# Patient Record
Sex: Female | Born: 1935 | Race: White | Hispanic: No | Smoking: Never smoker
Health system: Southern US, Community
[De-identification: ages and names within clinical notes are randomized; demographics above are authoritative.]

## PROBLEM LIST (undated history)

## (undated) DIAGNOSIS — I482 Chronic atrial fibrillation, unspecified: Secondary | ICD-10-CM

## (undated) DIAGNOSIS — I10 Essential (primary) hypertension: Secondary | ICD-10-CM

## (undated) DIAGNOSIS — E785 Hyperlipidemia, unspecified: Secondary | ICD-10-CM

## (undated) DIAGNOSIS — Z860100 Personal history of colon polyps, unspecified: Secondary | ICD-10-CM

## (undated) DIAGNOSIS — K648 Other hemorrhoids: Secondary | ICD-10-CM

## (undated) DIAGNOSIS — K5790 Diverticulosis of intestine, part unspecified, without perforation or abscess without bleeding: Secondary | ICD-10-CM

## (undated) DIAGNOSIS — F0390 Unspecified dementia without behavioral disturbance: Secondary | ICD-10-CM

## (undated) HISTORY — DX: Diverticulosis of intestine, part unspecified, without perforation or abscess without bleeding: K57.90

## (undated) HISTORY — DX: Unspecified dementia, unspecified severity, without behavioral disturbance, psychotic disturbance, mood disturbance, and anxiety: F03.90

## (undated) HISTORY — DX: Chronic atrial fibrillation, unspecified: I48.20

## (undated) HISTORY — PX: COLONOSCOPY: SHX174

## (undated) HISTORY — PX: BREAST LUMPECTOMY: SHX2

## (undated) HISTORY — DX: Personal history of colon polyps, unspecified: Z86.0100

## (undated) HISTORY — DX: Other hemorrhoids: K64.8

## (undated) HISTORY — PX: CATARACT EXTRACTION: SUR2

## (undated) HISTORY — DX: Personal history of colonic polyps: Z86.010

## (undated) HISTORY — PX: TUBAL LIGATION: SHX77

## (undated) HISTORY — DX: Essential (primary) hypertension: I10

## (undated) HISTORY — DX: Hyperlipidemia, unspecified: E78.5

---

## 2001-07-16 ENCOUNTER — Ambulatory Visit (HOSPITAL_COMMUNITY): Admission: RE | Admit: 2001-07-16 | Discharge: 2001-07-16 | Payer: Self-pay | Admitting: Family Medicine

## 2001-07-16 ENCOUNTER — Encounter: Payer: Self-pay | Admitting: Family Medicine

## 2001-10-02 ENCOUNTER — Other Ambulatory Visit: Admission: RE | Admit: 2001-10-02 | Discharge: 2001-10-02 | Payer: Self-pay | Admitting: Family Medicine

## 2002-07-17 ENCOUNTER — Ambulatory Visit (HOSPITAL_COMMUNITY): Admission: RE | Admit: 2002-07-17 | Discharge: 2002-07-17 | Payer: Self-pay | Admitting: Family Medicine

## 2002-07-17 ENCOUNTER — Encounter: Payer: Self-pay | Admitting: Family Medicine

## 2002-10-01 ENCOUNTER — Ambulatory Visit (HOSPITAL_COMMUNITY): Admission: RE | Admit: 2002-10-01 | Discharge: 2002-10-01 | Payer: Self-pay | Admitting: Family Medicine

## 2002-10-01 ENCOUNTER — Encounter: Payer: Self-pay | Admitting: Family Medicine

## 2003-07-21 ENCOUNTER — Ambulatory Visit (HOSPITAL_COMMUNITY): Admission: RE | Admit: 2003-07-21 | Discharge: 2003-07-21 | Payer: Self-pay | Admitting: Family Medicine

## 2003-07-21 ENCOUNTER — Encounter: Payer: Self-pay | Admitting: Family Medicine

## 2003-07-29 ENCOUNTER — Encounter: Payer: Self-pay | Admitting: Family Medicine

## 2003-07-29 ENCOUNTER — Ambulatory Visit (HOSPITAL_COMMUNITY): Admission: RE | Admit: 2003-07-29 | Discharge: 2003-07-29 | Payer: Self-pay | Admitting: Family Medicine

## 2004-06-23 ENCOUNTER — Ambulatory Visit (HOSPITAL_COMMUNITY): Admission: RE | Admit: 2004-06-23 | Discharge: 2004-06-23 | Payer: Self-pay | Admitting: *Deleted

## 2004-08-03 ENCOUNTER — Ambulatory Visit (HOSPITAL_COMMUNITY): Admission: RE | Admit: 2004-08-03 | Discharge: 2004-08-03 | Payer: Self-pay | Admitting: Family Medicine

## 2004-10-17 ENCOUNTER — Ambulatory Visit: Payer: Self-pay | Admitting: *Deleted

## 2004-11-16 ENCOUNTER — Ambulatory Visit: Payer: Self-pay | Admitting: *Deleted

## 2004-12-18 ENCOUNTER — Ambulatory Visit: Payer: Self-pay | Admitting: *Deleted

## 2005-01-19 ENCOUNTER — Ambulatory Visit: Payer: Self-pay | Admitting: *Deleted

## 2005-02-09 ENCOUNTER — Ambulatory Visit: Payer: Self-pay | Admitting: Cardiology

## 2005-03-15 ENCOUNTER — Ambulatory Visit: Payer: Self-pay | Admitting: *Deleted

## 2005-04-16 ENCOUNTER — Ambulatory Visit: Payer: Self-pay | Admitting: *Deleted

## 2005-05-21 ENCOUNTER — Ambulatory Visit: Payer: Self-pay | Admitting: *Deleted

## 2005-06-21 ENCOUNTER — Ambulatory Visit: Payer: Self-pay | Admitting: Cardiology

## 2005-07-06 ENCOUNTER — Ambulatory Visit: Payer: Self-pay | Admitting: Family Medicine

## 2005-07-19 ENCOUNTER — Ambulatory Visit: Payer: Self-pay | Admitting: *Deleted

## 2005-07-24 ENCOUNTER — Ambulatory Visit: Payer: Self-pay | Admitting: Gastroenterology

## 2005-08-06 ENCOUNTER — Ambulatory Visit (HOSPITAL_COMMUNITY): Admission: RE | Admit: 2005-08-06 | Discharge: 2005-08-06 | Payer: Self-pay | Admitting: Family Medicine

## 2005-08-17 ENCOUNTER — Ambulatory Visit: Payer: Self-pay | Admitting: Internal Medicine

## 2005-08-24 ENCOUNTER — Ambulatory Visit: Payer: Self-pay | Admitting: Gastroenterology

## 2005-08-24 ENCOUNTER — Encounter (INDEPENDENT_AMBULATORY_CARE_PROVIDER_SITE_OTHER): Payer: Self-pay | Admitting: Specialist

## 2005-08-29 ENCOUNTER — Ambulatory Visit (HOSPITAL_COMMUNITY): Admission: RE | Admit: 2005-08-29 | Discharge: 2005-08-29 | Payer: Self-pay | Admitting: Family Medicine

## 2005-09-14 ENCOUNTER — Ambulatory Visit: Payer: Self-pay | Admitting: *Deleted

## 2005-10-05 ENCOUNTER — Ambulatory Visit: Payer: Self-pay | Admitting: Cardiology

## 2005-11-02 ENCOUNTER — Ambulatory Visit: Payer: Self-pay | Admitting: Cardiology

## 2005-12-03 ENCOUNTER — Ambulatory Visit: Payer: Self-pay | Admitting: *Deleted

## 2005-12-31 ENCOUNTER — Ambulatory Visit: Payer: Self-pay | Admitting: *Deleted

## 2006-02-06 ENCOUNTER — Ambulatory Visit: Payer: Self-pay | Admitting: Cardiology

## 2006-03-15 ENCOUNTER — Ambulatory Visit: Payer: Self-pay | Admitting: Cardiology

## 2006-04-15 ENCOUNTER — Ambulatory Visit: Payer: Self-pay | Admitting: *Deleted

## 2006-05-15 ENCOUNTER — Ambulatory Visit: Payer: Self-pay | Admitting: *Deleted

## 2006-07-11 ENCOUNTER — Ambulatory Visit: Payer: Self-pay | Admitting: Family Medicine

## 2006-08-15 ENCOUNTER — Encounter: Admission: RE | Admit: 2006-08-15 | Discharge: 2006-08-15 | Payer: Self-pay | Admitting: Family Medicine

## 2006-10-29 ENCOUNTER — Ambulatory Visit: Payer: Self-pay | Admitting: Cardiology

## 2006-11-20 ENCOUNTER — Ambulatory Visit: Payer: Self-pay | Admitting: Cardiology

## 2006-12-24 ENCOUNTER — Ambulatory Visit: Payer: Self-pay | Admitting: Cardiology

## 2007-01-24 ENCOUNTER — Ambulatory Visit: Payer: Self-pay | Admitting: Cardiology

## 2007-02-07 ENCOUNTER — Ambulatory Visit: Payer: Self-pay | Admitting: Internal Medicine

## 2007-02-18 ENCOUNTER — Ambulatory Visit: Payer: Self-pay | Admitting: *Deleted

## 2007-02-28 ENCOUNTER — Ambulatory Visit: Payer: Self-pay | Admitting: Cardiology

## 2007-03-31 ENCOUNTER — Ambulatory Visit: Payer: Self-pay | Admitting: Cardiology

## 2007-04-29 ENCOUNTER — Ambulatory Visit: Payer: Self-pay | Admitting: Cardiology

## 2007-06-02 ENCOUNTER — Ambulatory Visit: Payer: Self-pay | Admitting: Internal Medicine

## 2007-07-03 ENCOUNTER — Ambulatory Visit: Payer: Self-pay | Admitting: Cardiology

## 2007-07-14 DIAGNOSIS — I10 Essential (primary) hypertension: Secondary | ICD-10-CM

## 2007-07-14 DIAGNOSIS — I4891 Unspecified atrial fibrillation: Secondary | ICD-10-CM

## 2007-07-14 DIAGNOSIS — M810 Age-related osteoporosis without current pathological fracture: Secondary | ICD-10-CM | POA: Insufficient documentation

## 2007-07-17 ENCOUNTER — Ambulatory Visit: Payer: Self-pay | Admitting: Family Medicine

## 2007-07-31 ENCOUNTER — Ambulatory Visit: Payer: Self-pay | Admitting: Cardiology

## 2007-08-28 ENCOUNTER — Ambulatory Visit: Payer: Self-pay | Admitting: Internal Medicine

## 2007-09-24 ENCOUNTER — Encounter: Admission: RE | Admit: 2007-09-24 | Discharge: 2007-09-24 | Payer: Self-pay | Admitting: Family Medicine

## 2007-09-24 ENCOUNTER — Encounter: Payer: Self-pay | Admitting: Family Medicine

## 2007-09-29 ENCOUNTER — Ambulatory Visit: Payer: Self-pay | Admitting: Cardiology

## 2007-10-27 ENCOUNTER — Ambulatory Visit: Payer: Self-pay | Admitting: Internal Medicine

## 2007-11-07 ENCOUNTER — Ambulatory Visit: Payer: Self-pay | Admitting: Cardiology

## 2007-12-02 ENCOUNTER — Ambulatory Visit: Payer: Self-pay | Admitting: Cardiovascular Disease

## 2007-12-29 ENCOUNTER — Ambulatory Visit: Payer: Self-pay | Admitting: Cardiology

## 2008-01-12 ENCOUNTER — Ambulatory Visit: Payer: Self-pay | Admitting: Cardiology

## 2008-02-09 ENCOUNTER — Ambulatory Visit: Payer: Self-pay | Admitting: Cardiology

## 2008-03-09 ENCOUNTER — Ambulatory Visit: Payer: Self-pay | Admitting: Cardiology

## 2008-04-06 ENCOUNTER — Ambulatory Visit: Payer: Self-pay | Admitting: Cardiology

## 2008-07-13 ENCOUNTER — Ambulatory Visit: Payer: Self-pay | Admitting: Cardiology

## 2008-08-12 ENCOUNTER — Ambulatory Visit: Payer: Self-pay | Admitting: Cardiology

## 2008-08-17 ENCOUNTER — Ambulatory Visit: Payer: Self-pay | Admitting: Family Medicine

## 2008-08-17 DIAGNOSIS — L02419 Cutaneous abscess of limb, unspecified: Secondary | ICD-10-CM | POA: Insufficient documentation

## 2008-08-17 DIAGNOSIS — R609 Edema, unspecified: Secondary | ICD-10-CM | POA: Insufficient documentation

## 2008-08-17 DIAGNOSIS — L03119 Cellulitis of unspecified part of limb: Secondary | ICD-10-CM

## 2008-08-27 ENCOUNTER — Ambulatory Visit: Payer: Self-pay | Admitting: Cardiology

## 2008-09-02 ENCOUNTER — Ambulatory Visit: Payer: Self-pay | Admitting: Cardiology

## 2008-09-02 ENCOUNTER — Encounter: Payer: Self-pay | Admitting: Cardiology

## 2008-09-02 ENCOUNTER — Ambulatory Visit (HOSPITAL_COMMUNITY): Admission: RE | Admit: 2008-09-02 | Discharge: 2008-09-02 | Payer: Self-pay | Admitting: Cardiology

## 2008-09-03 ENCOUNTER — Ambulatory Visit: Payer: Self-pay | Admitting: Family Medicine

## 2008-09-03 DIAGNOSIS — R599 Enlarged lymph nodes, unspecified: Secondary | ICD-10-CM | POA: Insufficient documentation

## 2008-09-06 ENCOUNTER — Ambulatory Visit: Payer: Self-pay | Admitting: Cardiology

## 2008-09-16 ENCOUNTER — Ambulatory Visit: Payer: Self-pay | Admitting: Cardiology

## 2008-09-17 ENCOUNTER — Encounter: Admission: RE | Admit: 2008-09-17 | Discharge: 2008-09-17 | Payer: Self-pay | Admitting: Family Medicine

## 2008-09-17 ENCOUNTER — Telehealth: Payer: Self-pay | Admitting: Family Medicine

## 2008-10-08 ENCOUNTER — Ambulatory Visit (HOSPITAL_COMMUNITY): Admission: RE | Admit: 2008-10-08 | Discharge: 2008-10-08 | Payer: Self-pay | Admitting: Cardiology

## 2008-10-08 ENCOUNTER — Telehealth: Payer: Self-pay | Admitting: Family Medicine

## 2008-10-08 ENCOUNTER — Ambulatory Visit: Payer: Self-pay | Admitting: Cardiology

## 2008-11-24 ENCOUNTER — Ambulatory Visit: Payer: Self-pay | Admitting: Cardiology

## 2008-11-24 ENCOUNTER — Encounter (INDEPENDENT_AMBULATORY_CARE_PROVIDER_SITE_OTHER): Payer: Self-pay | Admitting: *Deleted

## 2008-11-24 LAB — CONVERTED CEMR LAB
Cholesterol: 226 mg/dL
HDL: 65 mg/dL
T3, Free: 3.2 pg/mL
Triglycerides: 120 mg/dL

## 2008-12-09 ENCOUNTER — Encounter: Admission: RE | Admit: 2008-12-09 | Discharge: 2008-12-09 | Payer: Self-pay | Admitting: Family Medicine

## 2009-01-13 ENCOUNTER — Ambulatory Visit: Payer: Self-pay | Admitting: Cardiology

## 2009-01-20 ENCOUNTER — Ambulatory Visit: Payer: Self-pay | Admitting: Cardiology

## 2009-01-31 ENCOUNTER — Ambulatory Visit: Payer: Self-pay | Admitting: Cardiology

## 2009-02-17 ENCOUNTER — Ambulatory Visit: Payer: Self-pay | Admitting: Cardiology

## 2009-03-10 ENCOUNTER — Ambulatory Visit: Payer: Self-pay | Admitting: Cardiology

## 2009-04-11 ENCOUNTER — Ambulatory Visit: Payer: Self-pay | Admitting: Cardiology

## 2009-04-28 ENCOUNTER — Ambulatory Visit: Payer: Self-pay | Admitting: Cardiology

## 2009-05-26 ENCOUNTER — Ambulatory Visit: Payer: Self-pay | Admitting: Cardiology

## 2009-06-30 ENCOUNTER — Ambulatory Visit: Payer: Self-pay | Admitting: Cardiology

## 2009-07-02 IMAGING — US US EXTREM LOW VENOUS BILAT
1 series · 13 of 24 positions shown · non-contrast
Comparison: None

CLINICAL DATA: Bilateral lower extremity edema

VENOUS DUPLEX ULTRASOUND OF BILATERAL LOWER EXTREMITIES
TECHNIQUE: Gray-scale sonography with graded compression, as well
as color Doppler and duplex ultrasound, were performed to evaluate
the deep venous system of both lower extremities from the level of
the common femoral vein through the popliteal and proximal calf
veins. Spectral Doppler was utilized to evaulate flow at rest and
with distal augmentation maneuvers.

[Series 1: unknown · 13 of 75 slices shown]
[im 1/75]
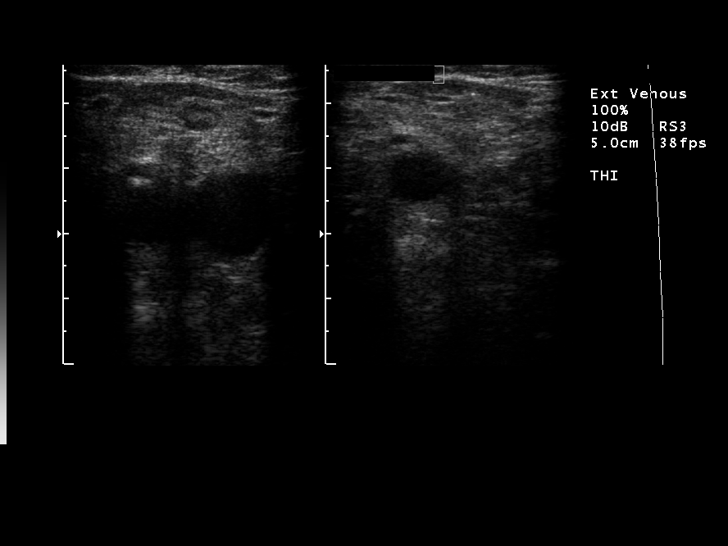
[im 7/75]
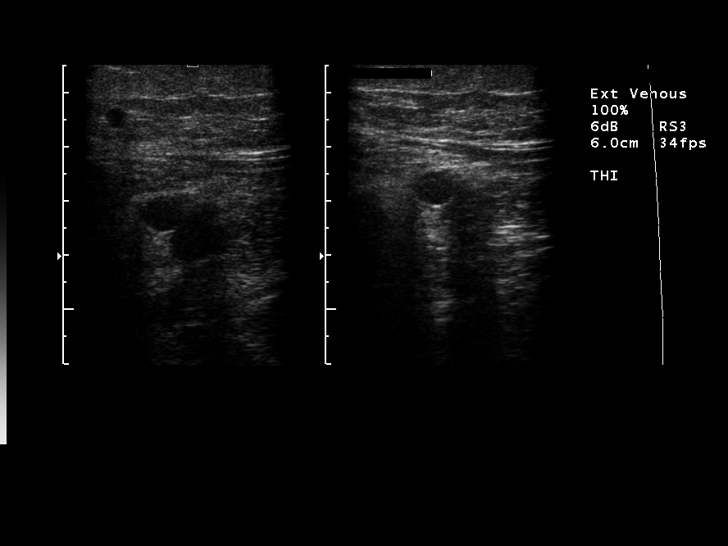
[im 13/75]
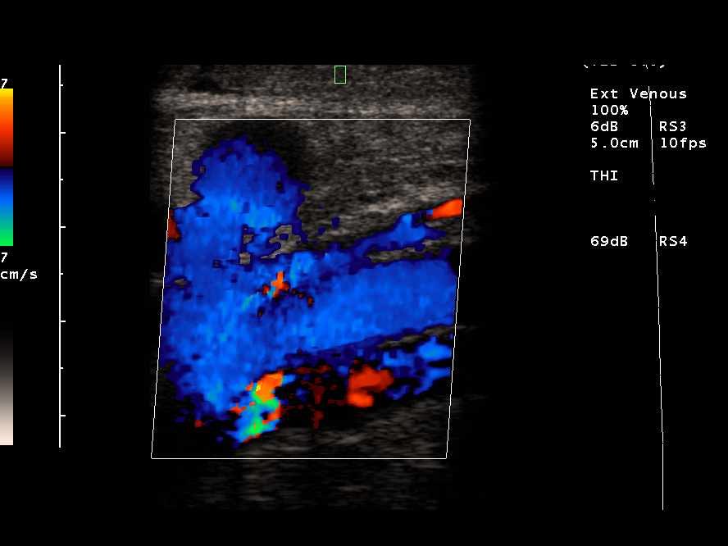
[im 20/75]
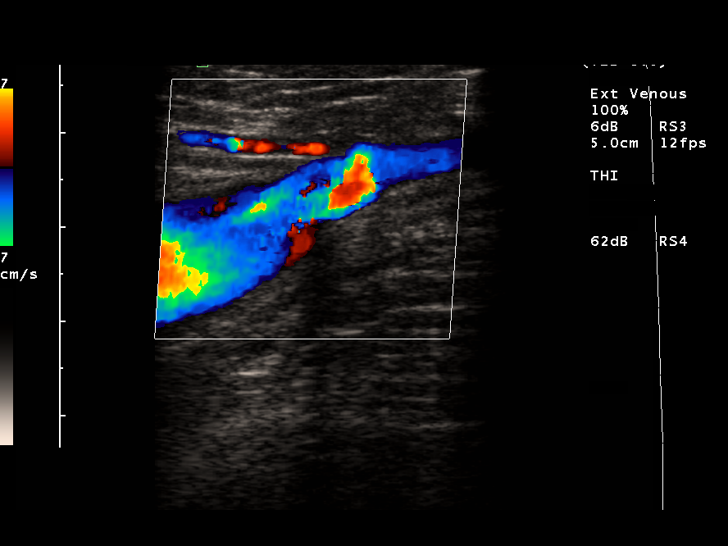
[im 26/75]
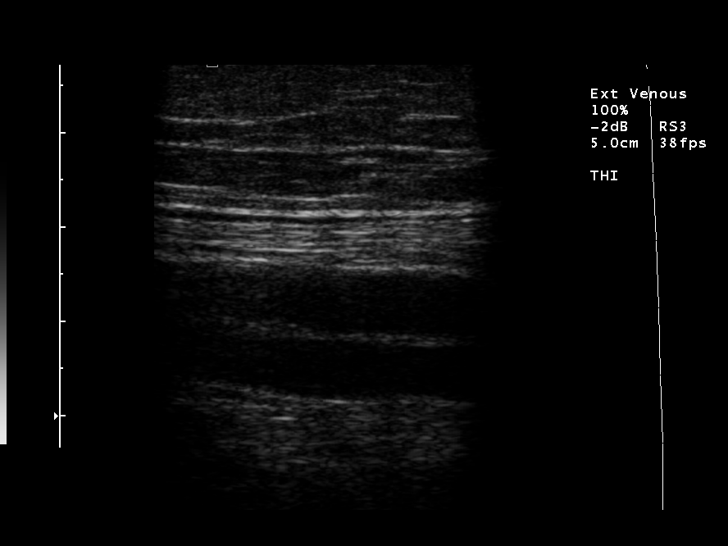
[im 33/75]
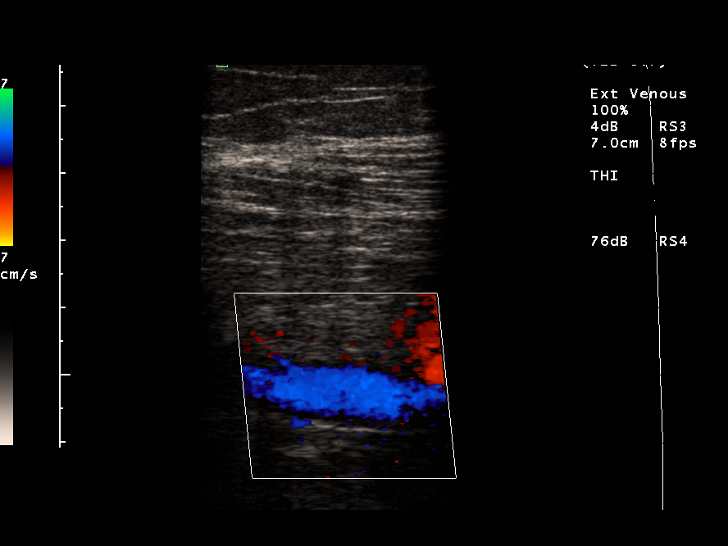
[im 39/75]
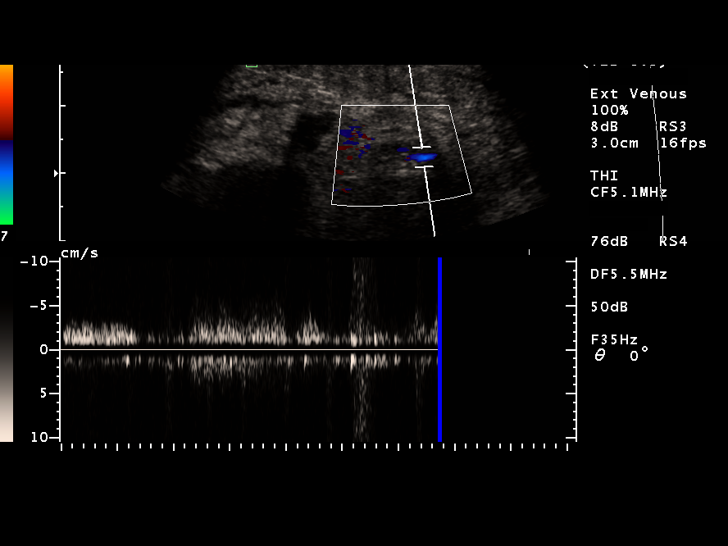
[im 42/75]
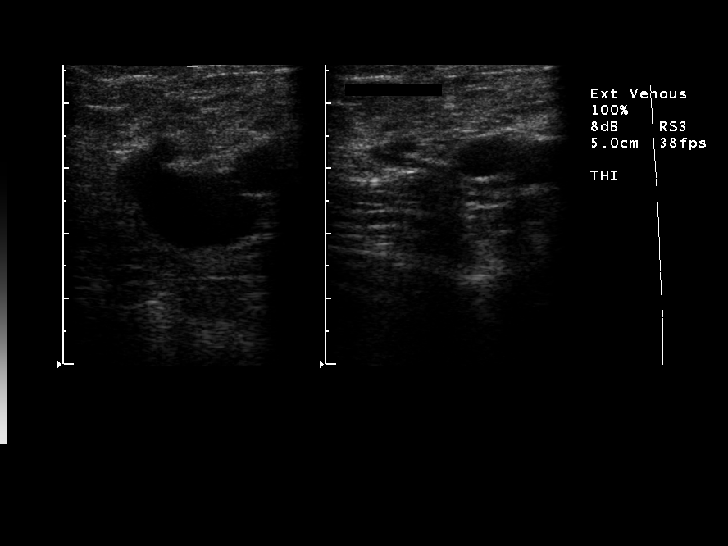
[im 49/75]
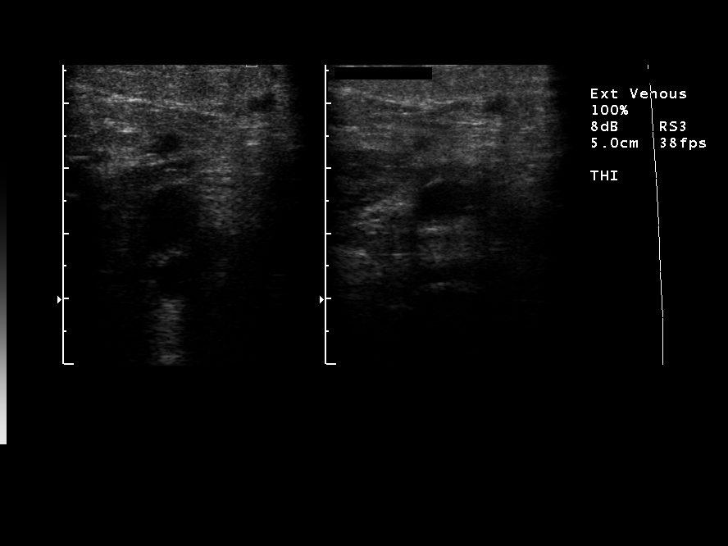
[im 55/75]
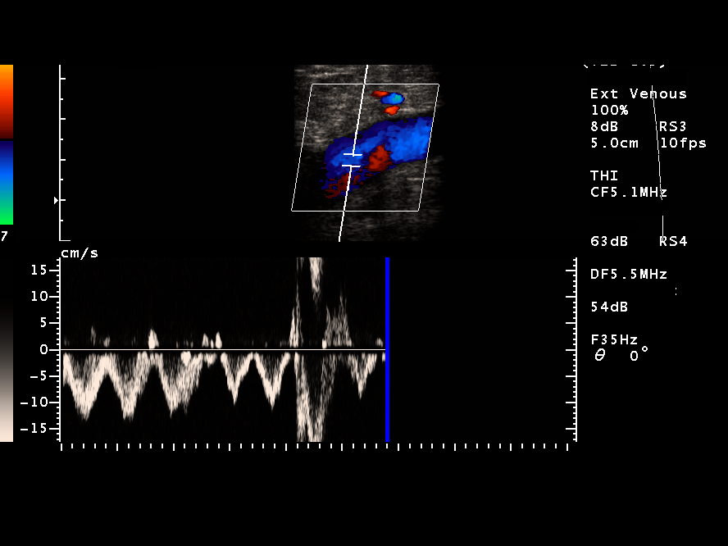
[im 62/75]
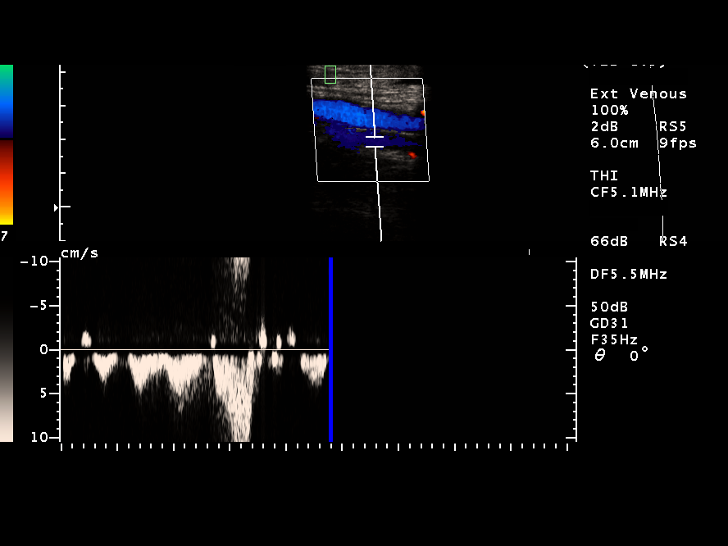
[im 68/75]
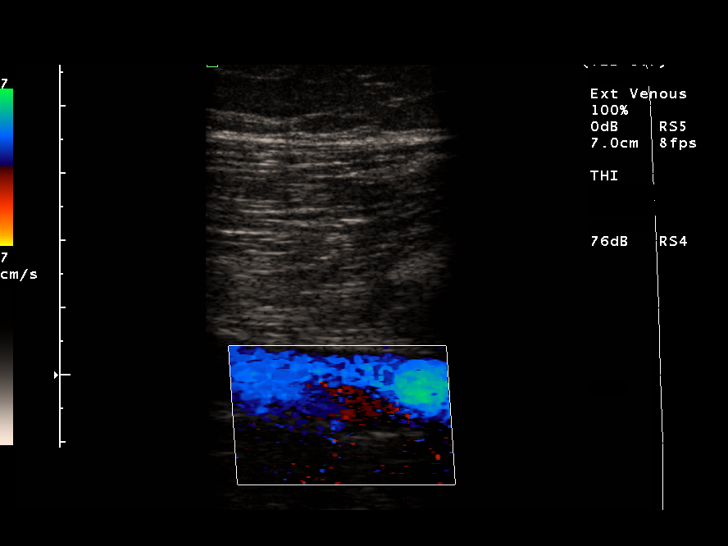
[im 75/75]
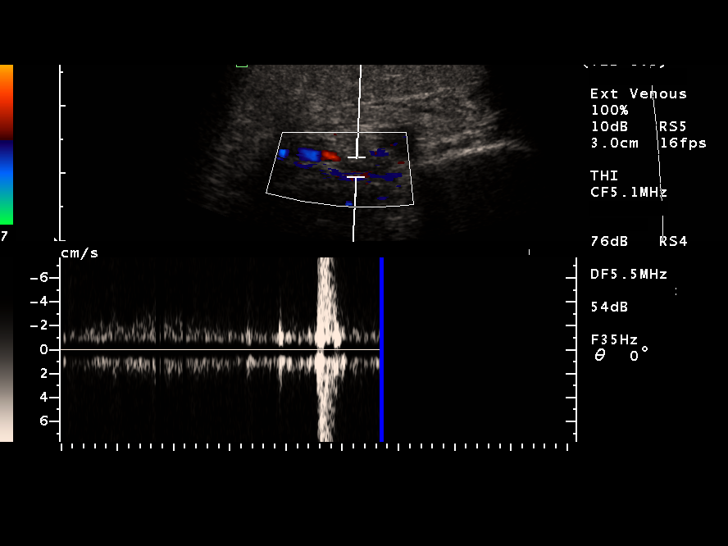

[13 of 24 positions shown; findings below may reference images not displayed]

FINDINGS: Prominent lymph node in the right inguinal region
measuring up to 2.4 cm.  Probable varicosity the right popliteal
region.  There is normal compressibility, augmentation and color
Doppler flow in the right common femoral vein, right femoral vein
and right popliteal vein.  This flow in the right posterior tibial
veins.

There is a prominent lymph node in the left inguinal region.  Node
measures up to 3.4 cm.  Normal compressibility, augmentation and
color flow in the left common femoral vein, left femoral vein and
left popliteal vein.  Evidence of flow in the left posterior tibial
veins.
IMPRESSION: Negative for DVT.

Prominent lymph nodes in both inguinal regions.

Varicosity in the right popliteal region.

## 2009-07-11 ENCOUNTER — Encounter: Payer: Self-pay | Admitting: *Deleted

## 2009-07-14 ENCOUNTER — Ambulatory Visit: Payer: Self-pay | Admitting: Cardiology

## 2009-07-17 IMAGING — US US ABDOMEN COMPLETE
1 series · 13 of 25 positions shown · non-contrast
Comparison: 09/06/2008 CT.

CLINICAL DATA: Abnormal CT.

ABDOMEN ULTRASOUND
TECHNIQUE: Complete abdominal ultrasound examination was performed
including evaluation of the liver, gallbladder, bile ducts,
pancreas, kidneys, spleen, IVC, and abdominal aorta.

[Series 1: us abdomen complete · 0.32mm/px · 13 of 67 slices shown]
[im 1/67]
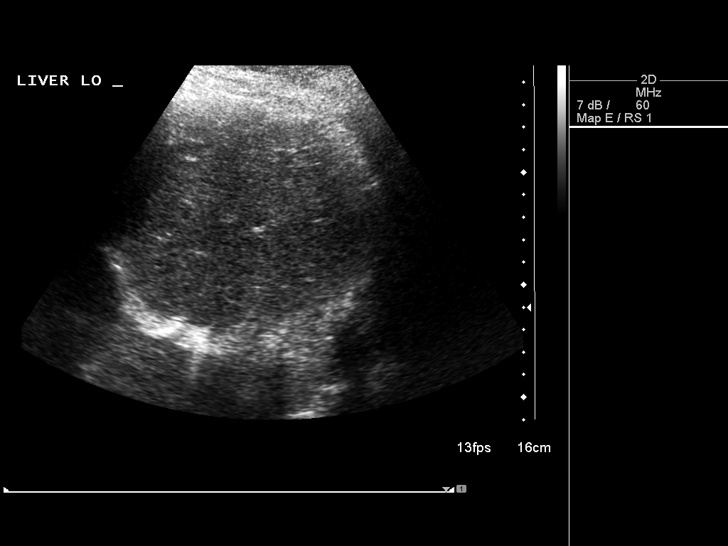
[im 6/67]
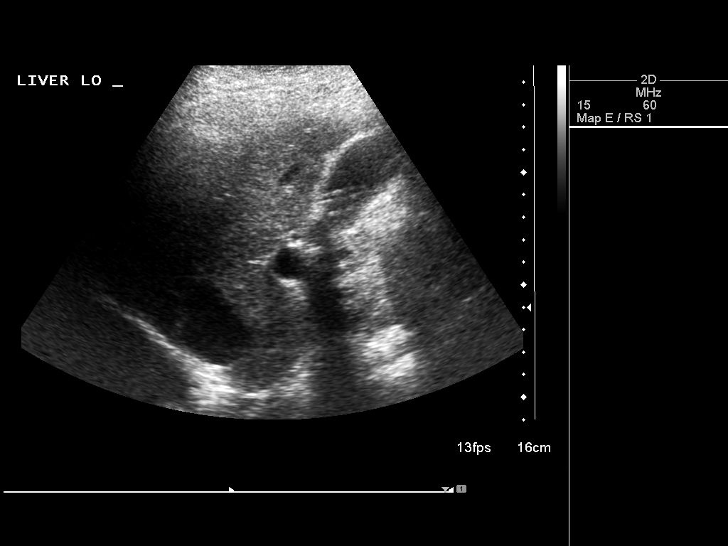
[im 12/67]
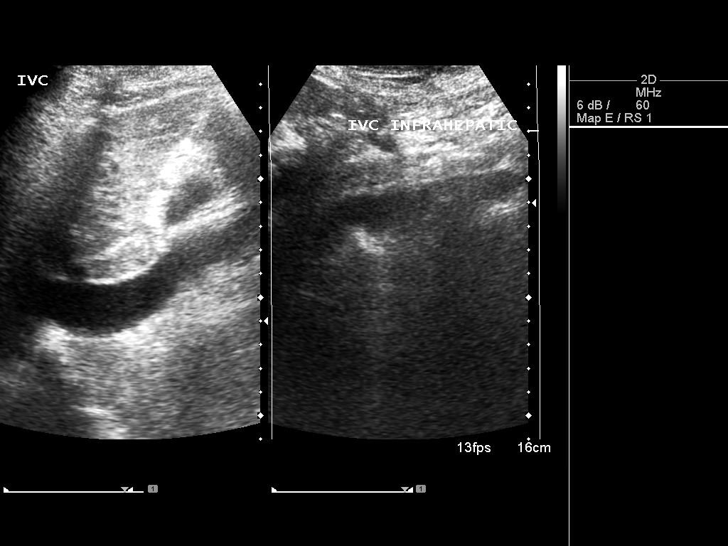
[im 17/67]
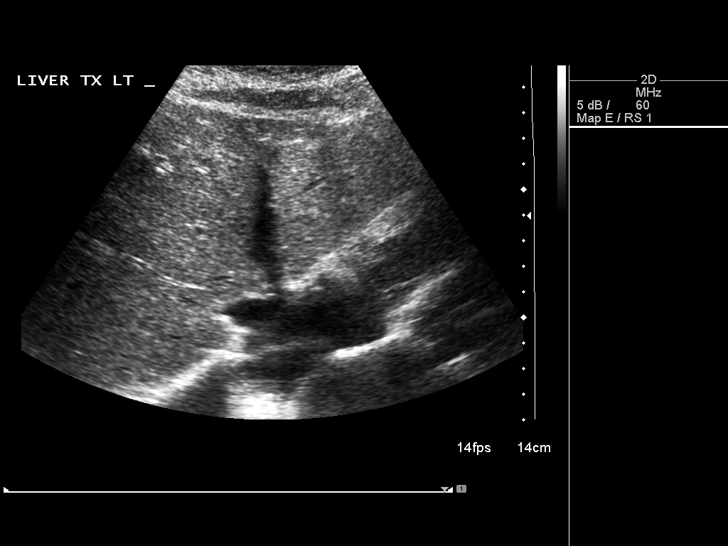
[im 23/67]
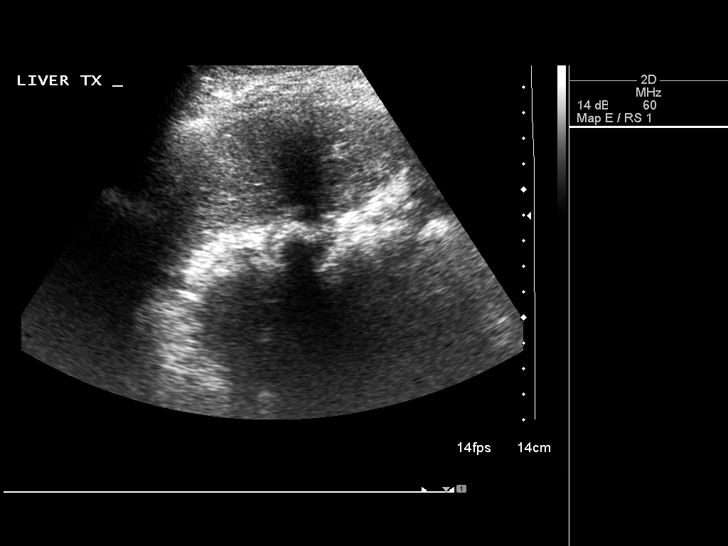
[im 28/67]
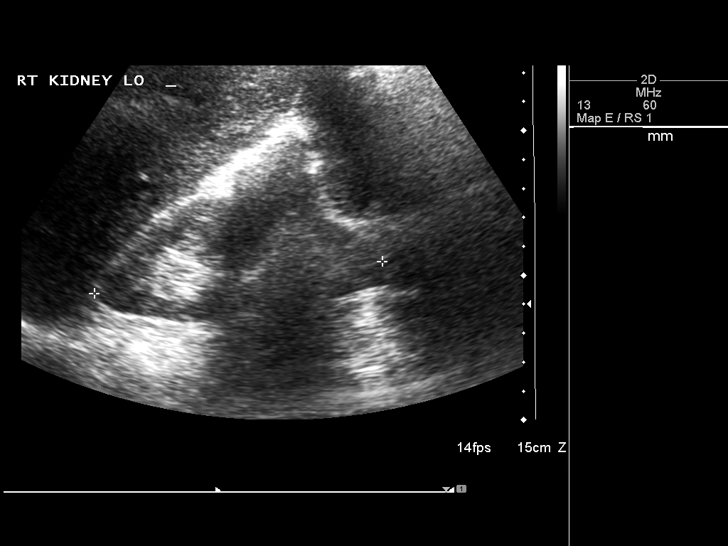
[im 34/67]
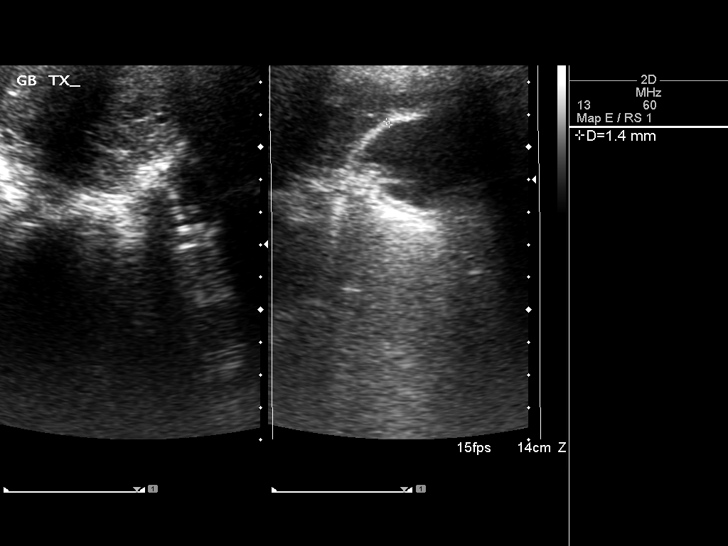
[im 39/67]
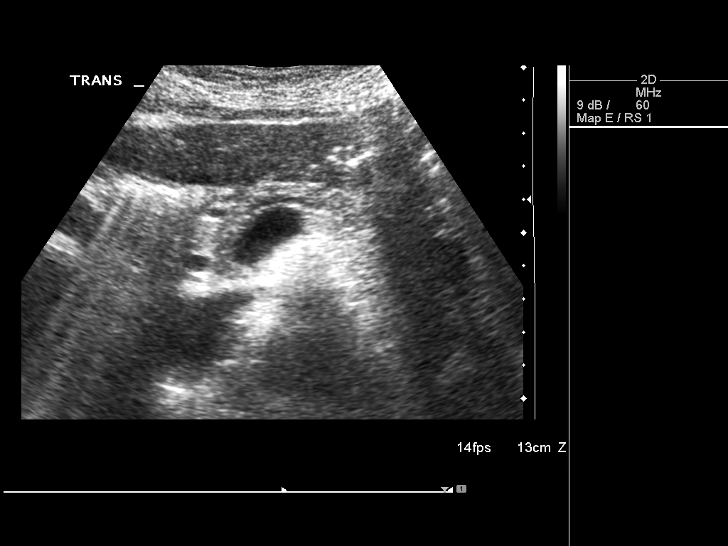
[im 45/67]
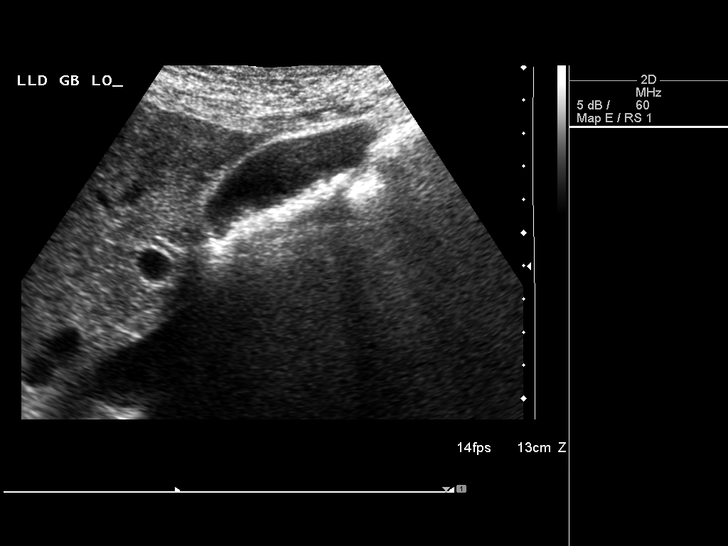
[im 50/67]
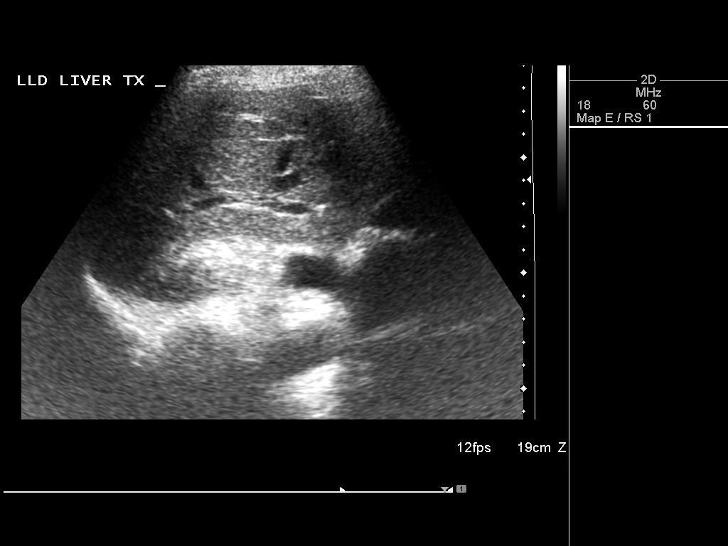
[im 56/67]
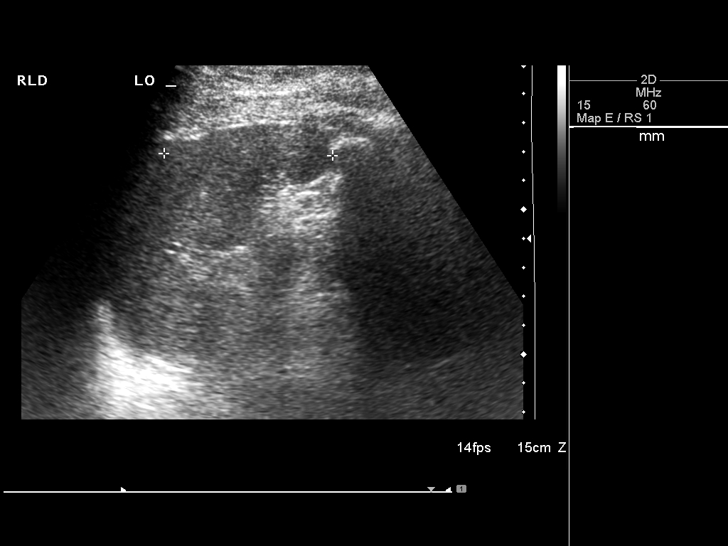
[im 61/67]
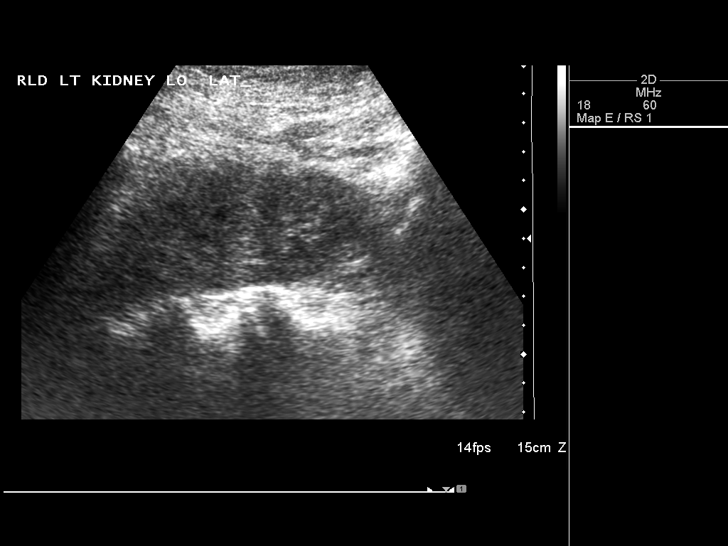
[im 67/67]
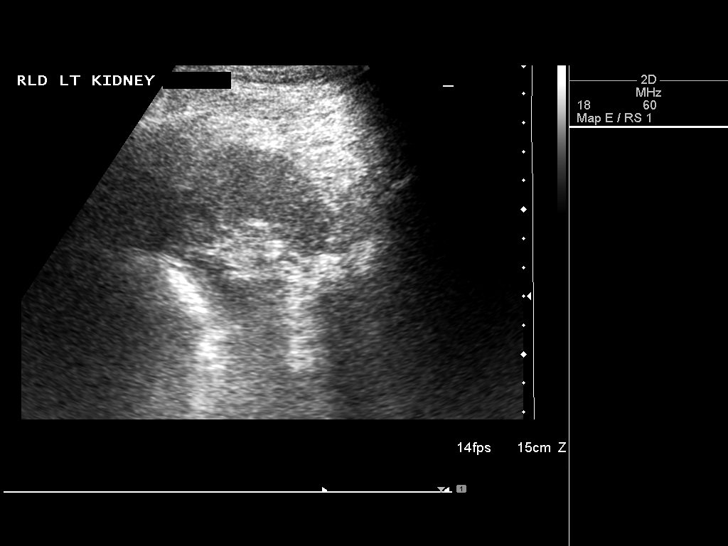

[13 of 25 positions shown; findings below may reference images not displayed]

FINDINGS: The liver has a slightly atypical contour with prominent
medially extending inferior right lobe.  A discrete liver lesion is
not identified.  It is possible what was noted on the prior CT scan
is caused by streak artifact from adjacent barium containing bowel.
One could perform MR at the present time for further delineation or
help confirm stability on follow-up CT scan in the next few months.

Multiple mobile small gallstones.  The patient was not tender over
this region during scanning.  Gallbladder wall thickness within
normal limits.  No pericholecystic fluid.  Common bile duct 3.9 mm.
No intrahepatic biliary duct dilatation.

Slightly limited evaluation of pancreatic tail otherwise no
pancreatic masses identified.  Pancreatic duct 1.6 mm.  Inferior
vena cava unremarkable.  Atherosclerotic type changes of the aorta
without aneurysmal dilatation.

Right kidney 10 cm and left kidney 10.6 cm length.  Slightly limit
evaluation of portions of the right kidney secondary to adjacent
bowel.  No hydronephrosis or discrete neural mass is noted.  Spleen
unremarkable.
IMPRESSION: No liver lesion is detected by ultrasound.  What was noted on CT
may be related to an artifact.  One can further evaluate this with
MR at the present time or establish stability on follow-up CT in
the next few months.

Multiple small mobile gallstones without gallbladder wall
thickening or pericholecystic fluid.  The the patient was not
tender over this region during scanning.

## 2009-08-10 ENCOUNTER — Ambulatory Visit: Payer: Self-pay | Admitting: Cardiology

## 2009-08-10 LAB — CONVERTED CEMR LAB: POC INR: 3.7

## 2009-08-30 ENCOUNTER — Telehealth: Payer: Self-pay | Admitting: Family Medicine

## 2009-09-05 ENCOUNTER — Ambulatory Visit: Payer: Self-pay | Admitting: Family Medicine

## 2009-09-07 ENCOUNTER — Ambulatory Visit: Payer: Self-pay | Admitting: Cardiology

## 2009-09-19 ENCOUNTER — Ambulatory Visit: Payer: Self-pay | Admitting: Family Medicine

## 2009-09-19 ENCOUNTER — Encounter: Payer: Self-pay | Admitting: Family Medicine

## 2009-09-22 ENCOUNTER — Ambulatory Visit: Payer: Self-pay | Admitting: Cardiology

## 2009-10-13 ENCOUNTER — Ambulatory Visit: Payer: Self-pay | Admitting: Cardiology

## 2009-11-10 ENCOUNTER — Encounter (INDEPENDENT_AMBULATORY_CARE_PROVIDER_SITE_OTHER): Payer: Self-pay | Admitting: Cardiology

## 2009-11-11 ENCOUNTER — Telehealth: Payer: Self-pay | Admitting: Family Medicine

## 2009-12-14 ENCOUNTER — Encounter (INDEPENDENT_AMBULATORY_CARE_PROVIDER_SITE_OTHER): Payer: Self-pay | Admitting: Cardiology

## 2009-12-22 ENCOUNTER — Encounter: Payer: Self-pay | Admitting: Family Medicine

## 2009-12-28 ENCOUNTER — Telehealth (INDEPENDENT_AMBULATORY_CARE_PROVIDER_SITE_OTHER): Payer: Self-pay | Admitting: *Deleted

## 2009-12-31 ENCOUNTER — Encounter: Admission: RE | Admit: 2009-12-31 | Discharge: 2009-12-31 | Payer: Self-pay | Admitting: Neurology

## 2010-02-07 ENCOUNTER — Encounter (INDEPENDENT_AMBULATORY_CARE_PROVIDER_SITE_OTHER): Payer: Self-pay | Admitting: *Deleted

## 2010-02-09 DIAGNOSIS — E785 Hyperlipidemia, unspecified: Secondary | ICD-10-CM | POA: Insufficient documentation

## 2010-02-10 ENCOUNTER — Ambulatory Visit: Payer: Self-pay | Admitting: Cardiology

## 2010-02-21 ENCOUNTER — Encounter: Admission: RE | Admit: 2010-02-21 | Discharge: 2010-02-21 | Payer: Self-pay | Admitting: Family Medicine

## 2010-03-01 ENCOUNTER — Ambulatory Visit: Payer: Self-pay | Admitting: Cardiology

## 2010-03-22 ENCOUNTER — Ambulatory Visit: Payer: Self-pay | Admitting: Cardiology

## 2010-04-13 ENCOUNTER — Encounter (INDEPENDENT_AMBULATORY_CARE_PROVIDER_SITE_OTHER): Payer: Self-pay | Admitting: Pharmacist

## 2010-05-25 ENCOUNTER — Encounter (INDEPENDENT_AMBULATORY_CARE_PROVIDER_SITE_OTHER): Payer: Self-pay | Admitting: Pharmacist

## 2010-06-19 ENCOUNTER — Ambulatory Visit: Payer: Self-pay | Admitting: Cardiology

## 2010-07-06 ENCOUNTER — Ambulatory Visit: Payer: Self-pay | Admitting: Cardiology

## 2010-07-06 LAB — CONVERTED CEMR LAB: POC INR: 1.3

## 2010-07-17 ENCOUNTER — Ambulatory Visit: Payer: Self-pay | Admitting: Cardiology

## 2010-08-03 ENCOUNTER — Ambulatory Visit: Payer: Self-pay | Admitting: Cardiology

## 2010-08-24 ENCOUNTER — Ambulatory Visit: Payer: Self-pay | Admitting: Cardiology

## 2010-08-24 LAB — CONVERTED CEMR LAB: POC INR: 2.4

## 2010-09-15 ENCOUNTER — Ambulatory Visit: Payer: Self-pay | Admitting: Family Medicine

## 2010-09-18 LAB — CONVERTED CEMR LAB
AST: 33 units/L (ref 0–37)
Alkaline Phosphatase: 64 units/L (ref 39–117)
BUN: 13 mg/dL (ref 6–23)
Basophils Relative: 0.5 % (ref 0.0–3.0)
CO2: 31 meq/L (ref 19–32)
Calcium: 9.4 mg/dL (ref 8.4–10.5)
Chloride: 101 meq/L (ref 96–112)
Direct LDL: 142.9 mg/dL
GFR calc non Af Amer: 101.74 mL/min (ref >60)
HDL: 68.1 mg/dL (ref >39.00)
Hemoglobin: 14.1 g/dL (ref 12.0–15.0)
Lymphs Abs: 1.2 10*3/uL (ref 0.7–4.0)
MCHC: 33.6 g/dL (ref 30.0–36.0)
MCV: 95.7 fL (ref 78.0–100.0)
Platelets: 162 10*3/uL (ref 150.0–400.0)
Potassium: 3.9 meq/L (ref 3.5–5.1)
RBC: 4.37 M/uL (ref 3.87–5.11)
Sodium: 143 meq/L (ref 135–145)
Total Bilirubin: 1.4 mg/dL — ABNORMAL HIGH (ref 0.3–1.2)
Total CHOL/HDL Ratio: 3
Triglycerides: 81 mg/dL (ref 0.0–149.0)

## 2010-09-21 ENCOUNTER — Ambulatory Visit: Payer: Self-pay | Admitting: Cardiology

## 2010-09-21 LAB — CONVERTED CEMR LAB: POC INR: 2.2

## 2010-11-02 ENCOUNTER — Ambulatory Visit: Payer: Self-pay | Admitting: Cardiology

## 2010-11-02 LAB — CONVERTED CEMR LAB: POC INR: 2.4

## 2010-11-30 ENCOUNTER — Ambulatory Visit: Admit: 2010-11-30 | Payer: Self-pay

## 2010-12-17 ENCOUNTER — Encounter: Payer: Self-pay | Admitting: Family Medicine

## 2010-12-24 LAB — CONVERTED CEMR LAB
ALT: 15 units/L (ref 0–35)
ALT: 16 units/L (ref 0–35)
ALT: 21 units/L (ref 0–35)
AST: 23 units/L (ref 0–37)
AST: 24 units/L (ref 0–37)
Albumin: 3.8 g/dL (ref 3.5–5.2)
Alkaline Phosphatase: 45 units/L (ref 39–117)
Alkaline Phosphatase: 47 units/L (ref 39–117)
Basophils Absolute: 0 10*3/uL (ref 0.0–0.1)
Basophils Relative: 0.7 % (ref 0.0–3.0)
Bilirubin, Direct: 0.2 mg/dL (ref 0.0–0.3)
Bilirubin, Direct: 0.2 mg/dL (ref 0.0–0.3)
CO2: 28 meq/L (ref 19–32)
CO2: 29 meq/L (ref 19–32)
Calcium: 9.2 mg/dL (ref 8.4–10.5)
Calcium: 9.6 mg/dL (ref 8.4–10.5)
Chloride: 105 meq/L (ref 96–112)
Cholesterol: 188 mg/dL (ref 0–200)
Cholesterol: 212 mg/dL — ABNORMAL HIGH (ref 0–200)
Creatinine, Ser: 0.7 mg/dL (ref 0.4–1.2)
Creatinine, Ser: 0.8 mg/dL (ref 0.4–1.2)
Eosinophils Absolute: 0 10*3/uL (ref 0.0–0.7)
Eosinophils Absolute: 0.2 10*3/uL (ref 0.0–0.7)
Eosinophils Relative: 1.8 % (ref 0.0–5.0)
Eosinophils Relative: 3.4 % (ref 0.0–5.0)
GFR calc Af Amer: 106 mL/min
GFR calc Af Amer: 91 mL/min
GFR calc non Af Amer: 75 mL/min
GFR calc non Af Amer: 87.04 mL/min (ref >60)
Glucose, Bld: 91 mg/dL (ref 70–99)
HCT: 35.5 % — ABNORMAL LOW (ref 36.0–46.0)
HDL: 52.2 mg/dL (ref >39.0)
HDL: 55.9 mg/dL (ref >39.00)
Hemoglobin: 13.2 g/dL (ref 12.0–15.0)
LDL Cholesterol: 117 mg/dL — ABNORMAL HIGH (ref 0–99)
Lymphocytes Relative: 33.3 % (ref 12.0–46.0)
Lymphs Abs: 1.3 10*3/uL (ref 0.7–4.0)
MCHC: 32.9 g/dL (ref 30.0–36.0)
MCV: 95 fL (ref 78.0–100.0)
Monocytes Absolute: 0.6 10*3/uL (ref 0.1–1.0)
Monocytes Relative: 7.3 % (ref 3.0–11.0)
Monocytes Relative: 7.4 % (ref 3.0–12.0)
Monocytes Relative: 9.6 % (ref 3.0–12.0)
Neutro Abs: 2.6 10*3/uL (ref 1.4–7.7)
Neutro Abs: 4.3 10*3/uL (ref 1.4–7.7)
Neutrophils Relative %: 56.5 % (ref 43.0–77.0)
Neutrophils Relative %: 58 % (ref 43.0–77.0)
Platelets: 139 10*3/uL — ABNORMAL LOW (ref 150–400)
Platelets: 214 10*3/uL (ref 150–400)
Potassium: 3.5 meq/L (ref 3.5–5.1)
RBC: 4.14 M/uL (ref 3.87–5.11)
RBC: 4.17 M/uL (ref 3.87–5.11)
Sodium: 142 meq/L (ref 135–145)
TSH: 0.61 microintl units/mL (ref 0.35–5.50)
Total Bilirubin: 1.8 mg/dL — ABNORMAL HIGH (ref 0.3–1.2)
Total Protein: 6.6 g/dL (ref 6.0–8.3)
Total Protein: 6.8 g/dL (ref 6.0–8.3)
Triglycerides: 77 mg/dL (ref 0.0–149.0)
WBC: 4 10*3/uL — ABNORMAL LOW (ref 4.5–10.5)

## 2010-12-26 NOTE — Assessment & Plan Note (Signed)
Summary: CPX (PT WILL COME IN FASTING) // RS   Vital Signs:  Patient profile:   77 year old female Height:      66 inches Weight:      152 pounds BMI:     24.62 O2 Sat:      95 % Temp:     98.2 degrees F Pulse rate:   82 / minute BP sitting:   110 / 70  (left arm) Cuff size:   regular  Vitals Entered By: Pura Spice, RN (September 15, 2010 9:16 AM) CC: cpx  no problems fasting    History of Present Illness: 77 yr old female here with her daughter for a cpx. She feels fine and has no concerns. She started seeing Dr. Anne Hahn this past spring for dementia, and she has been on Aricept for 6 months or so. She tolerates it well. Her daughter has not seen any real improvement in her memory, but there has been no decline either. She eats well, and she sleeps well.   Allergies (verified): No Known Drug Allergies  Past History:  Past Medical History: Chronic atrial fibrillation, sees Dr.Robert  Rothbart  in [place] Hypertension Hyperlipidemia Osteoporosis, last DEXA 09-19-09 Gastrointestinal: Clubbing polyps; diverticulosis; internal hemorrhoids, sees Dr. Russella Dar Pedal edema dementia, sees Dr. Lesia Sago  Past Surgical History: Colonoscopy 9- 29-06 per Dr. Russella Dar, we decided to not do any more after that Cataract extraction per Dr. Seward Speck Lumpectomy, right breast-2000 Tubal ligation-1975  Past History:  Care Management: Cardiology: Dr  Dietrich Pates Ophthalmology: DR Daisy Lazar  Family History: Reviewed history from 02/10/2010 and no changes required. Family history of arrhythmia  Social History: Reviewed history from 08/17/2008 and no changes required. Never Smoked Alcohol use-no Drug use-no Regular exercise-no Widow Retired  Review of Systems  The patient denies anorexia, fever, weight loss, weight gain, vision loss, decreased hearing, hoarseness, chest pain, syncope, dyspnea on exertion, peripheral edema, prolonged cough, headaches, hemoptysis, abdominal pain,  melena, hematochezia, severe indigestion/heartburn, hematuria, incontinence, genital sores, muscle weakness, suspicious skin lesions, transient blindness, difficulty walking, depression, unusual weight change, abnormal bleeding, enlarged lymph nodes, angioedema, breast masses, and testicular masses.         Flu Vaccine Consent Questions     Do you have a history of severe allergic reactions to this vaccine? no    Any prior history of allergic reactions to egg and/or gelatin? no    Do you have a sensitivity to the preservative Thimersol? no    Do you have a past history of Guillan-Barre Syndrome? no    Do you currently have an acute febrile illness? no    Have you ever had a severe reaction to latex? no    Vaccine information given and explained to patient? yes    Are you currently pregnant? no    Lot Number:[id number]   Exp Date:05/26/2011   Site Given  Left Deltoid IM .signsign   Physical Exam  General:  Well-developed,well-nourished,in no acute distress; alert,appropriate and cooperative throughout examination Head:  Normocephalic and atraumatic without obvious abnormalities. No apparent alopecia or balding. Eyes:  No corneal or conjunctival inflammation noted. EOMI. Perrla. Funduscopic exam benign, without hemorrhages, exudates or papilledema. Vision grossly normal. Ears:  External ear exam shows no significant lesions or deformities.  Otoscopic examination reveals clear canals, tympanic membranes are intact bilaterally without bulging, retraction, inflammation or discharge. Hearing is grossly normal bilaterally. Nose:  External nasal examination shows no deformity or inflammation. Nasal mucosa are pink and moist without  lesions or exudates. Mouth:  Oral mucosa and oropharynx without lesions or exudates.  Teeth in good repair. Neck:  No deformities, masses, or tenderness noted. Chest Wall:  No deformities, masses, or tenderness noted. Lungs:  Normal respiratory effort, chest expands  symmetrically. Lungs are clear to auscultation, no crackles or wheezes. Heart:  normal rate, no murmur, no gallop, no rub, no JVD, no HJR, and irregular rhythm.   Abdomen:  Bowel sounds positive,abdomen soft and non-tender without masses, organomegaly or hernias noted. Msk:  No deformity or scoliosis noted of thoracic or lumbar spine.   Pulses:  R and L carotid,radial,femoral,dorsalis pedis and posterior tibial pulses are full and equal bilaterally Extremities:  No clubbing, cyanosis, edema, or deformity noted with normal full range of motion of all joints.   Neurologic:  No cranial nerve deficits noted. Station and gait are normal. Plantar reflexes are down-going bilaterally. DTRs are symmetrical throughout. Sensory, motor and coordinative functions appear intact. Skin:  Intact without suspicious lesions or rashes Cervical Nodes:  No lymphadenopathy noted Axillary Nodes:  No palpable lymphadenopathy Inguinal Nodes:  No significant adenopathy Psych:  normally interactive and good eye contact.  Affect is bright. She is oriented to self only   Impression & Recommendations:  Problem # 1:  ATRIAL FIBRILLATION (ICD-427.31)  Her updated medication list for this problem includes:    Metoprolol Tartrate 50 Mg Tabs (Metoprolol tartrate) .Marland Kitchen..Marland Kitchen Two times a day    Coumadin 5 Mg Tabs (Warfarin sodium) .Marland Kitchen... As directed  Orders: UA Dipstick w/o Micro (automated)  (81003) Venipuncture (16109) TLB-Lipid Panel (80061-LIPID) TLB-BMP (Basic Metabolic Panel-BMET) (80048-METABOL) TLB-CBC Platelet - w/Differential (85025-CBCD) TLB-Hepatic/Liver Function Pnl (80076-HEPATIC) TLB-TSH (Thyroid Stimulating Hormone) (84443-TSH) Specimen Handling (60454)  Problem # 2:  HYPERTENSION (ICD-401.9)  Her updated medication list for this problem includes:    Metoprolol Tartrate 50 Mg Tabs (Metoprolol tartrate) .Marland Kitchen..Marland Kitchen Two times a day    Furosemide 40 Mg Tabs (Furosemide) ..... Once daily  Problem # 3:   HYPERLIPIDEMIA (ICD-272.4)  Problem # 4:  LEG EDEMA (ICD-782.3)  Her updated medication list for this problem includes:    Furosemide 40 Mg Tabs (Furosemide) ..... Once daily  Problem # 5:  OSTEOPOROSIS (ICD-733.00)  The following medications were removed from the medication list:    Alendronate Sodium 70 Mg Tabs (Alendronate sodium) .Marland Kitchen... 1 by mouth q week . take with 8 ounces of water on and empty stomach Her updated medication list for this problem includes:    Evista 60 Mg Tabs (Raloxifene hcl) .Marland Kitchen... 1 by mouth once daily  Complete Medication List: 1)  Evista 60 Mg Tabs (Raloxifene hcl) .Marland Kitchen.. 1 by mouth once daily 2)  Metoprolol Tartrate 50 Mg Tabs (Metoprolol tartrate) .... Two times a day 3)  Coumadin 5 Mg Tabs (Warfarin sodium) .... As directed 4)  Furosemide 40 Mg Tabs (Furosemide) .... Once daily 5)  Aricept 5 Mg Tabs (Donepezil hcl) .... Dr Anne Hahn  Other Orders: Flu Vaccine 22yrs + MEDICARE PATIENTS [phone]) Administration Flu vaccine - MCR (B1478)  Patient Instructions: 1)  get fasting labs today Prescriptions: FUROSEMIDE 40 MG TABS (FUROSEMIDE) once daily  #30 x 11   Entered and Authorized by:   Nelwyn Salisbury MD   Signed by:   Nelwyn Salisbury MD on 09/15/2010   Method used:   Electronically to        [facility]  [address] [facility] (retail)       [address]  [place], [place]  [address]       Ph: [phone]       Fax: [phone]   RxID:   [id number] COUMADIN 5 MG  TABS (WARFARIN SODIUM) as directed  #30 x 11   Entered and Authorized by:   Nelwyn Salisbury MD   Signed by:   Nelwyn Salisbury MD on 09/15/2010   Method used:   Electronically to        [facility] [address] [facility] (retail)       [address]       [place], [place]  [address]       Ph: [phone]       Fax: [phone]   RxID:   [id number] METOPROLOL TARTRATE 50 MG TABS (METOPROLOL TARTRATE) two times a day  #60 x 11   Entered and Authorized by:   Nelwyn Salisbury MD    Signed by:   Nelwyn Salisbury MD on 09/15/2010   Method used:   Electronically to        [facility] [address] [facility] (retail)       [address]       [place], [place]  [address]       Ph: [phone]       Fax: [phone]   RxID:   [id number] EVISTA 60 MG TABS (RALOXIFENE HCL) 1 by mouth once daily  #30 x 11   Entered and Authorized by:   Nelwyn Salisbury MD   Signed by:   Nelwyn Salisbury MD on 09/15/2010   Method used:   Electronically to        [facility] [address] [facility] (retail)       [address]       [place], [place]  [address]       Ph: [phone]       Fax: [phone]   RxID:   [id number]    Orders Added: 1)  Flu Vaccine 15yrs + MEDICARE PATIENTS [Q2039] 2)  Administration Flu vaccine - MCR [G0008] 3)  Est. Patient Level IV [53976] 4)  UA Dipstick w/o Micro (automated)  [81003] 5)  Venipuncture [36415] 6)  TLB-Lipid Panel [80061-LIPID] 7)  TLB-BMP (Basic Metabolic Panel-BMET) [80048-METABOL] 8)  TLB-CBC Platelet - w/Differential [85025-CBCD] 9)  TLB-Hepatic/Liver Function Pnl [80076-HEPATIC] 10)  TLB-TSH (Thyroid Stimulating Hormone) [84443-TSH] 11)  Specimen Handling [99000]   Immunization History:  Pneumovax Immunization History:    Pneumovax:  historical (11/26/2009)   Immunization History:  Pneumovax Immunization History:    Pneumovax:  Historical (11/26/2009)   Eye Exam  last exam 2011 Dr Coralie Carpen      Appended Document: Orders Update     Clinical Lists Changes  Observations: Added new observation of COMMENTS: Wynona Canes, CMA  September 15, 2010 3:28 PM  (09/15/2010 15:27) Added new observation of PH URINE: 7.0  (09/15/2010 15:27) Added new observation of SPEC GR URIN: 1.015  (09/15/2010 15:27) Added new observation of APPEARANCE U: Clear  (09/15/2010 15:27) Added new observation of UA COLOR: yellow  (09/15/2010 15:27) Added new observation of WBC DIPSTK U: negative  (09/15/2010  15:27) Added new observation of NITRITE URN: negative  (09/15/2010 15:27) Added new observation of UROBILINOGEN: 0.2  (09/15/2010 15:27) Added new observation of PROTEIN, URN: negative  (  09/15/2010 15:27) Added new observation of BLOOD UR DIP: negative  (09/15/2010 15:27) Added new observation of KETONES URN: negative  (09/15/2010 15:27) Added new observation of BILIRUBIN UR: negative  (09/15/2010 15:27) Added new observation of GLUCOSE, URN: negative  (09/15/2010 15:27)      Laboratory Results   Urine Tests  Date/Time Recieved: September 15, 2010 3:28 PM  Date/Time Reported: September 15, 2010 3:28 PM   Routine Urinalysis   Color: yellow Appearance: Clear Glucose: negative   (Normal Range: Negative) Bilirubin: negative   (Normal Range: Negative) Ketone: negative   (Normal Range: Negative) Spec. Gravity: 1.015   (Normal Range: 1.003-1.035) Blood: negative   (Normal Range: Negative) pH: 7.0   (Normal Range: 5.0-8.0) Protein: negative   (Normal Range: Negative) Urobilinogen: 0.2   (Normal Range: 0-1) Nitrite: negative   (Normal Range: Negative) Leukocyte Esterace: negative   (Normal Range: Negative)    Comments: Wynona Canes, CMA  September 15, 2010 3:28 PM

## 2010-12-26 NOTE — Assessment & Plan Note (Signed)
Summary: PER DR.Bethann Qualley/PT HAVING PROBLEMS W/TAKING COUMADIN/TG      Allergies Added: NKDA  Visit Type:  Follow-up Primary Provider:  Nelwyn Salisbury MD   History of Present Illness: Return visit for this 77 year old woman with chronic atrial fibrillation maintained on anticoagulants, but otherwise not seen in the office for more than a year.  Over that interval, she has apparently done well.  Although she has a component of dementia, she appears familiar with her medical history.  She has not been hospitalized, seen in the emergency department or developed new medical issues.  She is active, including dancing, without cardiopulmonary symptoms.  She has not been taking Coumadin entirely reliably, but has maintained a therapeutic INR at most visits.  Her family is now assisting her in accurately taking her medications.  Current Medications (verified): 1)  Evista 60 Mg Tabs (Raloxifene Hcl) .Marland Kitchen.. 1 By Mouth Once Daily 2)  Metoprolol Tartrate 50 Mg Tabs (Metoprolol Tartrate) .... Two Times A Day 3)  Coumadin 5 Mg  Tabs (Warfarin Sodium) .... Take 1 Tablet By Mouth Once A Day 4)  Furosemide 40 Mg Tabs (Furosemide) .... Once Daily 5)  Alendronate Sodium 70 Mg  Tabs (Alendronate Sodium) .Marland Kitchen.. 1 By Mouth Q Week . Take With 8 Ounces of Water On and Empty Stomach  Allergies (verified): No Known Drug Allergies  Past History:  PMH, FH, and Social History reviewed and updated.  Family History: Family history of arrhythmia  Review of Systems       see HPI.  Physical Exam  General:    Thin, well developed; no acute distress:   Neck-No JVD; no carotid bruits: Lungs-No tachypnea, no rales; no rhonchi; no wheezes: Cardiovascular-normal PMI; normal S1 and S2: Abdomen-BS normal; soft and non-tender without masses or organomegaly:  Musculoskeletal-No deformities, no cyanosis or clubbing: Neurologic-Normal cranial nerves; symmetric strength and tone:  Skin-Warm, no significant  lesions: Extremities-Nl distal pulses; trace edema:     Impression & Recommendations:  Problem # 1:  ATRIAL FIBRILLATION (ICD-427.31) Patient has long-standing atrial fibrillation, which has resulted in no symptoms and no serious morbidity.  Thromboembolic risk factors include his sex, age and hypertension.  Warfarin will continue to be given as long as adequate safety is maintained.  Problem # 2:  ANTICOAGULATION (ICD-V58.61) INR is therapeutic at today's visit with a value of 3.3.  Patient's daughter was given a weekly pill holder to allow Coumadin to be given without the patient recalling current dosage.  We will continue to monitor closely in our anticoagulation clinic.  She underwent colonoscopy a few years ago with negative results.  A CBC 6 months ago was normal.  Stool for Hemoccult testing will be obtained.  Problem # 3:  HYPERTENSION (ICD-401.9) Blood pressure control is excellent; current medications will be continued.  Problem # 4:  HYPERLIPIDEMIA (ICD-272.4) Patient has mild hyperlipidemia with a recent profile measuring a cholesterol of 226, triglycerides of 120, HDL 65 and LDL of 137.  In the absence of known vascular disease or diabetes, pharmacologic therapy is not warranted.  I will plan to reassess this nice woman in one year.  Other Orders: Hemoccult Cards (Take Home) (Hemoccult Cards)  Patient Instructions: 1)  Your physician recommends that you schedule a follow-up appointment in: 1 YEAR 2)  Your physician has asked that you test your stool for blood. It is necessary to test 3 different stool specimens for accuracy. You will be given 3 hemoccult cards for specimen collection. For each stool specimen, place  a small portion of stool sample (from 2 different areas of the stool) into the 2 squares on the card. Close card. Repeat with 2 more stool specimens. Bring the cards back to the office for testing.

## 2010-12-26 NOTE — Progress Notes (Signed)
Summary: Noncompliance with anticoagulation  Phone Note Outgoing Call   Call placed by: Vashti Hey RN Call placed to: Patient Summary of Call: Called pt to find out why she has not been in for INR check.  Pt states she has not taken coumadin in several days.  When asked why,  she states she ran out and has not gotten it refilled.  Asked pt when she planned to get refilled.  Stated she didn't know.  Asked pt if she had the money to get refilled and she stated she did.  States she just hasn't been out to get it.  Explained the risks of not taking her coumadin as ordered and not having regular coumadin checks.  Pt states she understands.  Would not agree to get couamdin today and would not let me make INR appt for her.  Initial call taken by: Vashti Hey RN,  December 28, 2009 3:36 PM  Follow-up for Phone Call        Schedule appt.  Ask her to bring closest family member with her.  Cucumber Bing, M.D.  Follow-up by: Kathlen Brunswick, MD, [facility],  December 29, 2009 8:50 AM  Additional Follow-up for Phone Call Additional follow up Details #1::        Aurther Loft Please call daughter to schedule appt and have her come with pt. Additional Follow-up by: Vashti Hey RN,  December 29, 2009 8:54 AM    Additional Follow-up for Phone Call Additional follow up Details #2::    Left message with pt's daughter on 01/24/10 @ 1:41 to call office regarding appt./tg Spoke w/pt.s daughter/gonna call back with dates when she came come w/pt/states that she  lives out of town and is gonna have to take time off work/tg Follow-up by: Raechel Ache [facility],  January 24, 2010 1:36 PM

## 2010-12-26 NOTE — Medication Information (Signed)
Summary: ccr  Anticoagulant Therapy  Managed by: Vashti Hey, RN PCP: Nelwyn Salisbury MD Supervising MD: Diona Browner MD, Remi Deter Indication 1: Atrial Fibrillation (ICD-427.31) Lab Used: [facility] [place] Site: [place] INR POC 1.2  Dietary changes: no    Health status changes: no    Bleeding/hemorrhagic complications: no    Recent/future hospitalizations: no    Any changes in medication regimen? no    Recent/future dental: no  Any missed doses?: no       Is patient compliant with meds? yes       Allergies: No Known Drug Allergies  Anticoagulation Management History:      The patient is taking warfarin and comes in today for a routine follow up visit.  Positive risk factors for bleeding include an age of 75 years or older.  The bleeding index is 'intermediate risk'.  Positive CHADS2 values include History of HTN.  Negative CHADS2 values include Age > 75 years old.  The start date was 07/28/2004.  Anticoagulation responsible provider: Diona Browner MD, Remi Deter.  INR POC: 1.2.  Cuvette Lot#: [id number].  Exp: 03/2011.    Anticoagulation Management Assessment/Plan:      The patient's current anticoagulation dose is Coumadin 5 mg  tabs: Take 1 tablet by mouth once a day.  The target INR is 2 - 3.  The next INR is due 03/15/2010.  Anticoagulation instructions were given to patient.  Results were reviewed/authorized by Vashti Hey, RN.  She was notified by Vashti Hey RN.         Prior Anticoagulation Instructions: INR 3.3 TODAY CONTINUE 0.5 TABLET DAILY RECHECK INR IN 2 WEEKS  Current Anticoagulation Instructions: INR 1.2 Take coumadin 1 tablet tonight and tomorrow night (Wednesday and Thursday),  then take 1/2 tablet once daily except 1 tablet on Mondays

## 2010-12-26 NOTE — Medication Information (Signed)
Summary: ccr-lr  Anticoagulant Therapy  Managed by: Vashti Hey, RN PCP: Nelwyn Salisbury MD Supervising MD: Dietrich Pates MD, Molly Maduro Indication 1: Atrial Fibrillation (ICD-427.31) Lab Used: [facility] [place] Site: [place] INR POC 2.4  Dietary changes: no    Health status changes: no    Bleeding/hemorrhagic complications: no    Recent/future hospitalizations: no    Any changes in medication regimen? no    Recent/future dental: no  Any missed doses?: no       Is patient compliant with meds? yes       Allergies: No Known Drug Allergies  Anticoagulation Management History:      The patient is taking warfarin and comes in today for a routine follow up visit.  Positive risk factors for bleeding include an age of 73 years or older.  The bleeding index is 'intermediate risk'.  Positive CHADS2 values include History of HTN.  Negative CHADS2 values include Age > 34 years old.  The start date was 07/28/2004.  Anticoagulation responsible provider: Dietrich Pates MD, Molly Maduro.  INR POC: 2.4.  Cuvette Lot#: [id number].  Exp: 03/2011.    Anticoagulation Management Assessment/Plan:      The patient's current anticoagulation dose is Coumadin 5 mg  tabs: Take 1 tablet by mouth once a day.  The target INR is 2 - 3.  The next INR is due 09/21/2010.  Anticoagulation instructions were given to patient.  Results were reviewed/authorized by Vashti Hey, RN.  She was notified by Vashti Hey RN.         Prior Anticoagulation Instructions: INR 1.7 Take coumadin 1 1/2 tablets tonight then increase dose to 1 tablet once daily except 1/2 tablet on Mondays  Current Anticoagulation Instructions: INR 2.4 Continue coumadin 5mg  once daily except 2.5mg  on Mondays Daughter called with instructions

## 2010-12-26 NOTE — Letter (Signed)
Summary: Custom - Delinquent Coumadin 1  [facility]  [address], [place] [address]   Phone: [phone]  Fax: [phone]     Apr 13, 2010 MRN: [id number]   [facility] [address] [place], [place]  [address]   Dear Ms. Crowson,  This letter is being sent to you as a reminder that it is necessary for you to get your INR/PT checked regularly so that we can optimize your care.  Our records indicate that you were scheduled to have a test done recently.  As of today, we have not received the results of this test.  It is very important that you have your INR checked.  Please call our office at the number listed above to schedule an appointment at your earliest convenience.    If you have recently had your protime checked or have discontinued this medication, please contact our office at the above phone number to clarify this issue.  Thank you for this prompt attention to this important health care matter.  Sincerely, Vashti Hey RN  [facility] Team

## 2010-12-26 NOTE — Medication Information (Signed)
Summary: PROTIME/TG  Anticoagulant Therapy  Managed by: Vashti Hey, RN PCP: Nelwyn Salisbury MD Supervising MD: Dietrich Pates MD, Molly Maduro Indication 1: Atrial Fibrillation (ICD-427.31) Lab Used: [facility] [place] Site: [place]  Dietary changes: no    Health status changes: no    Bleeding/hemorrhagic complications: no    Recent/future hospitalizations: no    Any changes in medication regimen? no    Recent/future dental: no  Any missed doses?: no       Is patient compliant with meds? yes       Allergies: No Known Drug Allergies  Anticoagulation Management History:      The patient is taking warfarin and comes in today for a routine follow up visit.  Positive risk factors for bleeding include an age of 75 years or older.  The bleeding index is 'intermediate risk'.  Positive CHADS2 values include History of HTN.  Negative CHADS2 values include Age > 74 years old.  The start date was 07/28/2004.  Anticoagulation responsible provider: Dietrich Pates MD, Molly Maduro.  Cuvette Lot#: [id number].  Exp: 03/2011.    Anticoagulation Management Assessment/Plan:      The patient's current anticoagulation dose is Coumadin 5 mg  tabs: Take 1 tablet by mouth once a day.  The target INR is 2 - 3.  The next INR is due 07/06/2010.  Anticoagulation instructions were given to patient.  Results were reviewed/authorized by Vashti Hey, RN.  She was notified by Vashti Hey RN.         Prior Anticoagulation Instructions: INR 1.2 Take coumadin 5mg  tonight and tomorrow night then increase dose to 2.5mg  once daily except 5mg  on Mondays and Thursdays Talked with pt about compliance with meds again.  States daughter prepares her meds in pill box 1 month at a time.  Pt states she can remember to take extra coumadin tonight and tomorrow night and daughter will be here this weekend to refill pill box.  Current Anticoagulation Instructions: INR 1.2 Take coumadin 1 tablet tonight and tomorrow night then  increase dose to 1/2 tablet once daily except 1 tablet on Mondays and Thursdays Pt has not been taking dose as ordered.  Has only been taking 2.5mg  once daily per daughter wh fixed pill box.

## 2010-12-26 NOTE — Medication Information (Signed)
Summary: ccr-lr  Anticoagulant Therapy  Managed by: Vashti Hey, RN PCP: Nelwyn Salisbury MD Supervising MD: Daleen Squibb MD, Maisie Fus Indication 1: Atrial Fibrillation (ICD-427.31) Lab Used: [facility]  Site: [place] INR POC 1.8  Dietary changes: no    Health status changes: no    Bleeding/hemorrhagic complications: no    Recent/future hospitalizations: no    Any changes in medication regimen? no    Recent/future dental: no  Any missed doses?: no       Is patient compliant with meds? yes       Allergies: No Known Drug Allergies  Anticoagulation Management History:      The patient is taking warfarin and comes in today for a routine follow up visit.  Positive risk factors for bleeding include an age of 75 years or older.  The bleeding index is 'intermediate risk'.  Positive CHADS2 values include History of HTN.  Negative CHADS2 values include Age > 35 years old.  The start date was 07/28/2004.  Anticoagulation responsible provider: Daleen Squibb MD, Maisie Fus.  INR POC: 1.8.  Cuvette Lot#: [id number].  Exp: 03/2011.    Anticoagulation Management Assessment/Plan:      The patient's current anticoagulation dose is Coumadin 5 mg  tabs: Take 1 tablet by mouth once a day.  The target INR is 2 - 3.  The next INR is due 08/03/2010.  Anticoagulation instructions were given to patient/daughter Larita Fife.  Results were reviewed/authorized by Vashti Hey, RN.  She was notified by Vashti Hey RN.         Prior Anticoagulation Instructions: INR 1.3 Increase coumadin to 5mg  once daily except 2.5mg  on Mondays, Wednesdays and Fridays Called pt's daughter Larita Fife, who prepares pt's pill box, with new dosage.  She verbalized understanding and will be here on Sat. to fix pill box. She will remind pt of f/u appt on 07/17/10 at 11am.  Current Anticoagulation Instructions: INR 1.8 Spoke with daughter Larita Fife who lives in [place].  She fixes meds in pill box and will not be coming back till 08/05/10.   Told Lynn to have pt take coumadin 1 extra 5mg  tablet tonight then resume 5mg  once daily except 2.5mg  on M,W,F.  Will recheck INR 08/03/10.

## 2010-12-26 NOTE — Medication Information (Signed)
Summary: ccr-lr  Anticoagulant Therapy  Managed by: Vashti Hey, RN PCP: Nelwyn Salisbury MD Supervising MD: Diona Browner MD, Remi Deter Indication 1: Atrial Fibrillation (ICD-427.31) Lab Used: [facility] [place] Site: [place] INR POC 2.2  Dietary changes: no    Health status changes: no    Bleeding/hemorrhagic complications: no    Recent/future hospitalizations: no    Any changes in medication regimen? no    Recent/future dental: no  Any missed doses?: no       Is patient compliant with meds? yes       Allergies: No Known Drug Allergies  Anticoagulation Management History:      The patient is taking warfarin and comes in today for a routine follow up visit.  Positive risk factors for bleeding include an age of 77 years or older.  The bleeding index is 'intermediate risk'.  Positive CHADS2 values include History of HTN.  Negative CHADS2 values include Age > 18 years old.  The start date was 07/28/2004.  Anticoagulation responsible provider: Diona Browner MD, Remi Deter.  INR POC: 2.2.  Cuvette Lot#: [id number].  Exp: 03/2011.    Anticoagulation Management Assessment/Plan:      The patient's current anticoagulation dose is Coumadin 5 mg  tabs: as directed.  The target INR is 2 - 3.  The next INR is due 10/18/2010.  Anticoagulation instructions were given to patient/daughter.  Results were reviewed/authorized by Vashti Hey, RN.  She was notified by Vashti Hey RN.         Prior Anticoagulation Instructions: INR 2.4 Continue coumadin 5mg  once daily except 2.5mg  on Mondays Daughter called with instructions  Current Anticoagulation Instructions: INR 2.2 Continue coumadin 5mg  once daily except 2.5mg  on Mondays

## 2010-12-26 NOTE — Consult Note (Signed)
Summary: [facility]  [facility]   Imported By: Maryln Gottron 12/27/2009 15:18:22  _____________________________________________________________________  External Attachment:    Type:   Image     Comment:   External Document

## 2010-12-26 NOTE — Medication Information (Signed)
Summary: ccr-lr  Anticoagulant Therapy  Managed by: Vashti Hey, RN PCP: Nelwyn Salisbury MD Supervising MD: Dietrich Pates MD, Molly Maduro Indication 1: Atrial Fibrillation (ICD-427.31) Lab Used: [facility] [place] Site: [place] INR POC 1.3  Dietary changes: no    Health status changes: no    Bleeding/hemorrhagic complications: no    Recent/future hospitalizations: no    Any changes in medication regimen? no    Recent/future dental: no  Any missed doses?: no       Is patient compliant with meds? yes       Allergies: No Known Drug Allergies  Anticoagulation Management History:      The patient is taking warfarin and comes in today for a routine follow up visit.  Positive risk factors for bleeding include an age of 65 years or older.  The bleeding index is 'intermediate risk'.  Positive CHADS2 values include History of HTN.  Negative CHADS2 values include Age > 57 years old.  The start date was 07/28/2004.  Anticoagulation responsible provider: Dietrich Pates MD, Molly Maduro.  INR POC: 1.3.  Cuvette Lot#: [id number].  Exp: 03/2011.    Anticoagulation Management Assessment/Plan:      The patient's current anticoagulation dose is Coumadin 5 mg  tabs: Take 1 tablet by mouth once a day.  The target INR is 2 - 3.  The next INR is due 07/17/2010.  Anticoagulation instructions were given to patient/daughter Larita Fife.  Results were reviewed/authorized by Vashti Hey, RN.  She was notified by Vashti Hey RN.         Prior Anticoagulation Instructions: INR 1.2 Take coumadin 1 tablet tonight and tomorrow night then increase dose to 1/2 tablet once daily except 1 tablet on Mondays and Thursdays Pt has not been taking dose as ordered.  Has only been taking 2.5mg  once daily per daughter wh fixed pill box.   Current Anticoagulation Instructions: INR 1.3 Increase coumadin to 5mg  once daily except 2.5mg  on Mondays, Wednesdays and Fridays Called pt's daughter Larita Fife, who prepares pt's pill box,  with new dosage.  She verbalized understanding and will be here on Sat. to fix pill box. She will remind pt of f/u appt on 07/17/10 at 11am.

## 2010-12-26 NOTE — Letter (Signed)
Summary: Custom - Delinquent Coumadin 1  [facility]  [address], [place] [address]   Phone: [phone]  Fax: [phone]     December 14, 2009 MRN: [id number]   [facility] [address] [place], [place]  [address]   Dear Ms. Bies,  This letter is being sent to you as a reminder that it is necessary for you to get your INR/PT checked regularly so that we can optimize your care.  Our records indicate that you were scheduled to have a test done recently.  As of today, we have not received the results of this test.  It is very important that you have your INR checked.  Please call our office at the number listed above to schedule an appointment at your earliest convenience.    If you have recently had your protime checked or have discontinued this medication, please contact our office at the above phone number to clarify this issue.  Thank you for this prompt attention to this important health care matter.  Sincerely, Vashti Hey RN  [facility] Team   Please let our office know if you are no longer taking coumadin or if it is being managed by another physican so we can update our records.

## 2010-12-26 NOTE — Letter (Signed)
Summary: Custom - Delinquent Coumadin 2  [facility]  [address], [place] [address]   Phone: [phone]  Fax: [phone]     May 25, 2010 MRN: [id number]   [facility] [address] [place], [place]  [address]   Dear Ms. Schnepp,  We have attempted to contact you by phone and letter on multiple occasions to contact our office for important blood work associated with the blood thinner, warfarin (Coumadin).  Warfarin is a very important drug that can cause life threatening side effects including, bleeding, and thus requires close laboratory monitoring.  We are unable to accept responsibility for blood thinner-related health problems you may develop because you have not followed our recommendations for appropriate monitoring.  These may include abnormal bleeding occurrences and/or development of blood clots (stroke, heart attack, blood clots in legs or lungs, etc.).  We need for you to contact this office at the number listed above to schedule and complete this very important blood work.  Thank you for your assistance in this urgent matter.  Sincerely, Vashti Hey RN [facility] Team

## 2010-12-26 NOTE — Medication Information (Signed)
Summary: CCR      Allergies Added: NKDA Anticoagulant Therapy  Managed by: Teressa Lower, RN PCP: Nelwyn Salisbury MD Supervising MD: Dietrich Pates MD, Molly Maduro Indication 1: Atrial Fibrillation (ICD-427.31) Lab Used: [facility] [place] Site: [place] INR POC 3.3  Dietary changes: no    Health status changes: no    Bleeding/hemorrhagic complications: no    Recent/future hospitalizations: no    Any changes in medication regimen? no    Recent/future dental: no  Any missed doses?: no       Is patient compliant with meds? yes       Current Medications (verified): 1)  Evista 60 Mg Tabs (Raloxifene Hcl) .Marland Kitchen.. 1 By Mouth Once Daily 2)  Metoprolol Tartrate 50 Mg Tabs (Metoprolol Tartrate) .... Two Times A Day 3)  Coumadin 5 Mg  Tabs (Warfarin Sodium) .... Take 1 Tablet By Mouth Once A Day 4)  Furosemide 40 Mg Tabs (Furosemide) .... Once Daily 5)  Alendronate Sodium 70 Mg  Tabs (Alendronate Sodium) .Marland Kitchen.. 1 By Mouth Q Week . Take With 8 Ounces of Water On and Empty Stomach  Allergies (verified): No Known Drug Allergies  Anticoagulation Management History:      The patient is taking warfarin and comes in today for a routine follow up visit.  Positive risk factors for bleeding include an age of 33 years or older.  The bleeding index is 'intermediate risk'.  Positive CHADS2 values include History of HTN.  Negative CHADS2 values include Age > 58 years old.  The start date was 07/28/2004.  Anticoagulation responsible provider: Dietrich Pates MD, Molly Maduro.  INR POC: 3.3.  Cuvette Lot#: [id number].  Exp: 03/2011.    Anticoagulation Management Assessment/Plan:      The patient's current anticoagulation dose is Coumadin 5 mg  tabs: Take 1 tablet by mouth once a day.  The target INR is 2 - 3.  The next INR is due 10/19/2009.  Anticoagulation instructions were given to patient.  Results were reviewed/authorized by Teressa Lower, RN.         Prior Anticoagulation Instructions: INR  1.2 Take coumadin 10mg  tonight, 5mg  tomorrow night then resume 2.5mg  once daily except 5mg  on Tuesdays and Thursdays  Current Anticoagulation Instructions: INR 3.3 TODAY CONTINUE 0.5 TABLET DAILY RECHECK INR IN 2 WEEKS

## 2010-12-26 NOTE — Miscellaneous (Signed)
Summary: LIPIDS,TSH,T4,T3,11/24/2008  Clinical Lists Changes  Observations: Added new observation of LDL: 137 mg/dL (23/76/2831 5:17) Added new observation of HDL: 65 mg/dL (61/60/7371 0:62) Added new observation of TRIGLYC TOT: 120 mg/dL (69/48/5462 7:03) Added new observation of CHOLESTEROL: 226 mg/dL (50/07/3817 2:99) Added new observation of TSH: 0.2555 microintl units/mL (11/24/2008 8:45) Added new observation of T4, FREE: 1.15 ng/dL (37/16/9678 9:38) Added new observation of T3 FREE: 3.2 pg/mL (11/24/2008 8:45)

## 2010-12-26 NOTE — Medication Information (Signed)
Summary: ccr-lr  Anticoagulant Therapy  Managed by: Vashti Hey, RN PCP: Nelwyn Salisbury MD Supervising MD: Dietrich Pates MD, Molly Maduro Indication 1: Atrial Fibrillation (ICD-427.31) Lab Used: [facility] [place] Site: [place] INR POC 1.7  Dietary changes: no    Health status changes: no    Bleeding/hemorrhagic complications: no    Recent/future hospitalizations: no    Any changes in medication regimen? no    Recent/future dental: no  Any missed doses?: no       Is patient compliant with meds? yes       Allergies: No Known Drug Allergies  Anticoagulation Management History:      The patient is taking warfarin and comes in today for a routine follow up visit.  Positive risk factors for bleeding include an age of 75 years or older.  The bleeding index is 'intermediate risk'.  Positive CHADS2 values include History of HTN.  Negative CHADS2 values include Age > 77 years old.  The start date was 07/28/2004.  Anticoagulation responsible provider: Dietrich Pates MD, Molly Maduro.  INR POC: 1.7.  Cuvette Lot#: [id number].  Exp: 03/2011.    Anticoagulation Management Assessment/Plan:      The patient's current anticoagulation dose is Coumadin 5 mg  tabs: Take 1 tablet by mouth once a day.  The target INR is 2 - 3.  The next INR is due 08/24/2010.  Anticoagulation instructions were given to patient/daughter Larita Fife.  Results were reviewed/authorized by Vashti Hey, RN.  She was notified by Vashti Hey RN.         Prior Anticoagulation Instructions: INR 1.8 Spoke with daughter Larita Fife who lives in [place].  She fixes meds in pill box and will not be coming back till 08/05/10.  Told Lynn to have pt take coumadin 1 extra 5mg  tablet tonight then resume 5mg  once daily except 2.5mg  on M,W,F.  Will recheck INR 08/03/10.  Current Anticoagulation Instructions: INR 1.7 Take coumadin 1 1/2 tablets tonight then increase dose to 1 tablet once daily except 1/2 tablet on Mondays

## 2010-12-26 NOTE — Medication Information (Signed)
Summary: CCR  Anticoagulant Therapy  Managed by: Vashti Hey, RN PCP: Nelwyn Salisbury MD Supervising MD: Daleen Squibb MD, Maisie Fus Indication 1: Atrial Fibrillation (ICD-427.31) Lab Used: [facility] [place] Site: [place] INR POC 2.4  Dietary changes: no    Health status changes: no    Bleeding/hemorrhagic complications: no    Recent/future hospitalizations: no    Any changes in medication regimen? no    Recent/future dental: no  Any missed doses?: no       Is patient compliant with meds? yes       Allergies: No Known Drug Allergies  Anticoagulation Management History:      The patient is taking warfarin and comes in today for a routine follow up visit.  Positive risk factors for bleeding include an age of 33 years or older.  The bleeding index is 'intermediate risk'.  Positive CHADS2 values include History of HTN.  Negative CHADS2 values include Age > 44 years old.  The start date was 07/28/2004.  Anticoagulation responsible provider: Daleen Squibb MD, Maisie Fus.  INR POC: 2.4.  Cuvette Lot#: [id number].  Exp: 03/2011.    Anticoagulation Management Assessment/Plan:      The patient's current anticoagulation dose is Coumadin 5 mg  tabs: as directed.  The target INR is 2 - 3.  The next INR is due 11/30/2010.  Anticoagulation instructions were given to patient/daughter.  Results were reviewed/authorized by Vashti Hey, RN.  She was notified by Vashti Hey RN.         Prior Anticoagulation Instructions: INR 2.2 Continue coumadin 5mg  once daily except 2.5mg  on Mondays  Current Anticoagulation Instructions: INR 2.4 Continue coumadin 5mg  once daily except 2.5mg  on Mondays

## 2010-12-26 NOTE — Medication Information (Signed)
Summary: ccr-lr  Anticoagulant Therapy  Managed by: Vashti Hey, RN PCP: Nelwyn Salisbury MD Supervising MD: Dietrich Pates MD, Molly Maduro Indication 1: Atrial Fibrillation (ICD-427.31) Lab Used: [facility] [place] Site: [place] INR POC 1.2  Dietary changes: no    Health status changes: no    Bleeding/hemorrhagic complications: no    Recent/future hospitalizations: no    Any changes in medication regimen? no    Recent/future dental: no  Any missed doses?: no       Is patient compliant with meds? yes       Allergies: No Known Drug Allergies  Anticoagulation Management History:      The patient is taking warfarin and comes in today for a routine follow up visit.  Positive risk factors for bleeding include an age of 75 years or older.  The bleeding index is 'intermediate risk'.  Positive CHADS2 values include History of HTN.  Negative CHADS2 values include Age > 11 years old.  The start date was 07/28/2004.  Anticoagulation responsible provider: Dietrich Pates MD, Molly Maduro.  INR POC: 1.2.  Cuvette Lot#: [id number].  Exp: 03/2011.    Anticoagulation Management Assessment/Plan:      The patient's current anticoagulation dose is Coumadin 5 mg  tabs: Take 1 tablet by mouth once a day.  The target INR is 2 - 3.  The next INR is due 04/05/2010.  Anticoagulation instructions were given to patient.  Results were reviewed/authorized by Vashti Hey, RN.  She was notified by Vashti Hey RN.         Prior Anticoagulation Instructions: INR 1.2 Take coumadin 1 tablet tonight and tomorrow night (Wednesday and Thursday),  then take 1/2 tablet once daily except 1 tablet on Mondays  Current Anticoagulation Instructions: INR 1.2 Take coumadin 5mg  tonight and tomorrow night then increase dose to 2.5mg  once daily except 5mg  on Mondays and Thursdays Talked with pt about compliance with meds again.  States daughter prepares her meds in pill box 1 month at a time.  Pt states she can remember to  take extra coumadin tonight and tomorrow night and daughter will be here this weekend to refill pill box.

## 2011-01-12 ENCOUNTER — Encounter: Payer: Self-pay | Admitting: Cardiology

## 2011-01-12 ENCOUNTER — Encounter (INDEPENDENT_AMBULATORY_CARE_PROVIDER_SITE_OTHER): Payer: MEDICARE

## 2011-01-12 DIAGNOSIS — Z7901 Long term (current) use of anticoagulants: Secondary | ICD-10-CM

## 2011-01-12 DIAGNOSIS — I4891 Unspecified atrial fibrillation: Secondary | ICD-10-CM

## 2011-01-17 NOTE — Medication Information (Signed)
Summary: protime/tg  Anticoagulant Therapy  Managed by: Vashti Hey, RN PCP: Nelwyn Salisbury MD Supervising MD: Myrtis Ser MD, Tinnie Gens Indication 1: Atrial Fibrillation (ICD-427.31) Lab Used: [facility] [place] Site: [place] INR POC 2.9  Dietary changes: no    Health status changes: no    Bleeding/hemorrhagic complications: no    Recent/future hospitalizations: no    Any changes in medication regimen? no    Recent/future dental: no  Any missed doses?: no       Is patient compliant with meds? yes       Allergies: No Known Drug Allergies  Anticoagulation Management History:      The patient is taking warfarin and comes in today for a routine follow up visit.  Positive risk factors for bleeding include an age of 75 years or older.  The bleeding index is 'intermediate risk'.  Positive CHADS2 values include History of HTN.  Negative CHADS2 values include Age > 75 years old.  The start date was 07/28/2004.  Anticoagulation responsible provider: Myrtis Ser MD, Tinnie Gens.  INR POC: 2.9.  Cuvette Lot#: [id number].  Exp: 03/2011.    Anticoagulation Management Assessment/Plan:      The patient's current anticoagulation dose is Coumadin 5 mg  tabs: as directed.  The target INR is 2 - 3.  The next INR is due 02/08/2011.  Anticoagulation instructions were given to patient/daughter.  Results were reviewed/authorized by Vashti Hey, RN.  She was notified by Vashti Hey RN.         Prior Anticoagulation Instructions: INR 2.4 Continue coumadin 5mg  once daily except 2.5mg  on Mondays  Current Anticoagulation Instructions: INR 2.9 Continue coumadin 5mg  once daily except 2.5mg  on Mondays

## 2011-02-08 ENCOUNTER — Encounter: Payer: Self-pay | Admitting: Cardiology

## 2011-02-08 ENCOUNTER — Encounter (INDEPENDENT_AMBULATORY_CARE_PROVIDER_SITE_OTHER): Payer: MEDICARE

## 2011-02-08 DIAGNOSIS — I4891 Unspecified atrial fibrillation: Secondary | ICD-10-CM

## 2011-02-08 DIAGNOSIS — Z7901 Long term (current) use of anticoagulants: Secondary | ICD-10-CM

## 2011-02-08 LAB — CONVERTED CEMR LAB: POC INR: 2.3

## 2011-02-13 NOTE — Medication Information (Signed)
Summary: ccr-lr  Anticoagulant Therapy  Managed by: Vashti Hey, RN PCP: Nelwyn Salisbury MD Supervising MD: Dietrich Pates MD, Molly Maduro Indication 1: Atrial Fibrillation (ICD-427.31) Lab Used: [facility] [place] Site: [place] INR POC 2.3  Dietary changes: no    Health status changes: no    Bleeding/hemorrhagic complications: no    Recent/future hospitalizations: no    Any changes in medication regimen? no    Recent/future dental: no  Any missed doses?: no       Is patient compliant with meds? yes       Allergies: No Known Drug Allergies  Anticoagulation Management History:      The patient is taking warfarin and comes in today for a routine follow up visit.  Positive risk factors for bleeding include an age of 75 years or older.  The bleeding index is 'intermediate risk'.  Positive CHADS2 values include History of HTN.  Negative CHADS2 values include Age > 75 years old.  The start date was 07/28/2004.  Anticoagulation responsible provider: Dietrich Pates MD, Molly Maduro.  INR POC: 2.3.  Cuvette Lot#: [id number].  Exp: 03/2011.    Anticoagulation Management Assessment/Plan:      The patient's current anticoagulation dose is Coumadin 5 mg  tabs: as directed.  The target INR is 2 - 3.  The next INR is due 03/08/2011.  Anticoagulation instructions were given to patient/daughter.  Results were reviewed/authorized by Vashti Hey, RN.  She was notified by Vashti Hey RN.         Prior Anticoagulation Instructions: INR 2.9 Continue coumadin 5mg  once daily except 2.5mg  on Mondays  Current Anticoagulation Instructions: INR 2.3 Continue coumadin 5mg  once daily except 2.5mg  on Mondays

## 2011-03-07 ENCOUNTER — Encounter: Payer: Self-pay | Admitting: Cardiology

## 2011-03-07 DIAGNOSIS — I4891 Unspecified atrial fibrillation: Secondary | ICD-10-CM

## 2011-03-07 DIAGNOSIS — Z7901 Long term (current) use of anticoagulants: Secondary | ICD-10-CM | POA: Insufficient documentation

## 2011-03-08 ENCOUNTER — Ambulatory Visit (INDEPENDENT_AMBULATORY_CARE_PROVIDER_SITE_OTHER): Payer: MEDICARE | Admitting: *Deleted

## 2011-03-08 DIAGNOSIS — I4891 Unspecified atrial fibrillation: Secondary | ICD-10-CM

## 2011-03-08 DIAGNOSIS — Z7901 Long term (current) use of anticoagulants: Secondary | ICD-10-CM

## 2011-03-08 LAB — POCT INR: INR: 2

## 2011-03-12 ENCOUNTER — Other Ambulatory Visit: Payer: Self-pay | Admitting: Family Medicine

## 2011-03-12 DIAGNOSIS — Z1231 Encounter for screening mammogram for malignant neoplasm of breast: Secondary | ICD-10-CM

## 2011-04-04 ENCOUNTER — Ambulatory Visit
Admission: RE | Admit: 2011-04-04 | Discharge: 2011-04-04 | Disposition: A | Discharge status: Home / Self Care | Payer: MEDICARE | Source: Ambulatory Visit | Attending: Family Medicine | Admitting: Family Medicine

## 2011-04-04 DIAGNOSIS — Z1231 Encounter for screening mammogram for malignant neoplasm of breast: Secondary | ICD-10-CM

## 2011-04-05 ENCOUNTER — Ambulatory Visit (INDEPENDENT_AMBULATORY_CARE_PROVIDER_SITE_OTHER): Payer: MEDICARE | Admitting: *Deleted

## 2011-04-05 DIAGNOSIS — Z7901 Long term (current) use of anticoagulants: Secondary | ICD-10-CM

## 2011-04-05 DIAGNOSIS — I4891 Unspecified atrial fibrillation: Secondary | ICD-10-CM

## 2011-04-05 LAB — POCT INR: INR: 2.5

## 2011-04-10 NOTE — Assessment & Plan Note (Signed)
[facility]                       [place] CARDIOLOGY OFFICE NOTE   NAME:PASCHALAdryana, Mogensen                        MRN:          [id number]  DATE:11/24/2008                            DOB:          [birth year]    REFERRING PHYSICIAN:  Tera Mater. Clent Ridges, MD   Ms. Popoff returns to the office and scheduled for continued assessment  and treatment of atrial fibrillation, which is chronic, hypertension,  and pedal edema.  Since her last visit, she has done generally well.  She notes no palpitations, dyspnea, nor chest discomfort.  Edema has  improved.  She has had no significant intercurrent illnesses.  She is  somewhat confused regarding her medication, particularly her Coumadin,  which she is taking at a dose other than the last dose that we  recommended for her.  Her other medications include Evista 60 mg daily,  Fosamax 70 mg every week, furosemide 40 mg daily, metoprolol 50 mg  daily.  Her closest family members live 30 minutes away.  She has no  neighbors who can assist her at home.   PHYSICAL EXAMINATION:  Thin jocular older woman in no acute distress.  The weight is 154, 2 pounds less than at her last visit.  VITAL SIGNS:  Blood pressure 100/60, heart rate 75 and irregular,  respirations 14.  NECK:  No jugular venous distention; no carotid bruits.  LUNGS:  Clear.  CARDIAC:  Distant first and second heart sounds.  ABDOMEN:  Soft and nontender; no masses; no organomegaly.  EXTREMITIES:  Trace edema.  NEUROLOGIC:  Alert and oriented x3; somewhat vague about recent events,  her daily activities, and her diet.   INR is 3.4.   Rhythm Strip:  Atrial fibrillation with controlled ventricular response.   IMPRESSION:  Ms. Murtha is doing well from a cardiovascular standpoint.  We will encourage her to keep a calendar of daily warfarin use, so that  she can achieve better compliance.  We will slightly reduce her current  dose to 5 mg per day with 2.5 mg  one day per week.  Due to recent TSH  that was slightly subnormal, we will repeat a TSH with a free T4 and  free T3 level.  I have no recent lipid profile - one will be obtained.  If test results are good, I will plan to see this nice woman again in 6  months.     Gerrit Friends. Dietrich Pates, MD, [facility]  Electronically Signed    RMR/MedQ  DD: 11/24/2008  DT: 11/25/2008  Job #: [id number]

## 2011-04-10 NOTE — Letter (Signed)
August 27, 2008    Angie Sullivan A. Clent Ridges, M.D.  [address] [place], [place] [address]   RE:  Angie, Sullivan  MRN:  [id number]  /  DOB:  1934-02-01   Dear Angie Sullivan:   Angie Sullivan is seen in the office today at your request, somewhat  earlier than her scheduled to 1-year return.  As you know, she has  developed edema and erythema of her legs with pruritus and some skin  breakdown.  She also has had a rapid rate in atrial fibrillation, which  has not been noted previously.  She reports no dyspnea, orthopnea nor  PND.  He performed fairly extensive lab testing which revealed  reasonable lipids, mild normocytic anemia, borderline low potassium with  normal renal function and normal LFTs.  Albumin was slightly depressed  at 3.3.  BNP was somewhat elevated at 191.   CURRENT MEDICATIONS:  1. Evista 60 mg daily.  2. Calcium daily.  3. Warfarin as directed with stable and therapeutic anticoagulation.  4. Fosamax 70 mg weekly.  5. Metoprolol 50 mg b.i.d.  6. Furosemide 40 mg daily.   PHYSICAL EXAMINATION:  GENERAL:  Pleasant woman in no acute distress.  VITAL SIGNS:  The weight is 167, 1 pound more than last year.  Blood  pressure 100/65, heart rate 110 and irregular, respirations 14.  NECK:  No jugular venous distention.  LUNGS:  Clear.  CARDIAC:  Irregular rhythm; normal first and second heart sounds; no  third heart sound.  ABDOMEN:  Soft and nontender; no organomegaly.  EXTREMITIES:  2+ edema to the knees with erythema and some weeping of  the skin; distal pulses intact.   IMPRESSION:  Angie Sullivan has developed bilateral edema without a clear  etiology.  Her exam does not definitely show congestive heart failure.  We will obtain a chest x-ray.  I do not think she has nephrotic  syndrome, but a urinanalysis will be performed.  Stool for hemoccult  testing will be obtained in light of her chronic anticoagulation.  A  metabolic profile will be repeated to determine whether she is  hypokalemia on diuretics.  We will explain a low-salt diet to her and  recommend leg elevation.  Her dose of metoprolol will be increased  initially to 50 mg t.i.d.  We will continue to monitor her rate control  medication.  An echocardiogram will be obtained, primarily to exclude  rate-related cardiomyopathy.  We will also obtain venous ultrasound to  rule out DVT.  I will plan to see this nice woman again in 1 month.    Sincerely,      Angie Friends. Dietrich Pates, MD, [facility]  Electronically Signed    RMR/MedQ  DD: 08/27/2008  DT: 08/28/2008  Job #: [id number]

## 2011-04-10 NOTE — Assessment & Plan Note (Signed)
[facility]                       [place] CARDIOLOGY OFFICE NOTE   NAME:PASCHALOriya, Sullivan                        MRN:          [id number]  DATE:11/07/2007                            DOB:          June 19, 1934    Angie Sullivan returns to the office for continued assessment and treatment  of chronic atrial fibrillation and hypertension.  Since her last visit,  she has done beautifully.  She believes that she has been seen once by  Dr. Clent Ridges in the interim for a routine visit.  She reports no new medical  problems.   Her medications are unchanged from her last visit, except for an  increase in Lopressor to 50 mg b.i.d.   Her other drugs include:  1. Fosamax 70 mg every week.  2. Warfarin as directed with stable and therapeutic anticoagulation.  3. Calcium.  4. Evista.   PHYSICAL EXAMINATION:  GENERAL: A very pleasant woman in no acute  distress.  VITAL SIGNS:  The weight is 166, 4 pounds more than last year.  Blood  pressure 110/80, heart rate 76 and irregular, respirations 18.  NECK:  No jugular venous distention; normal carotid upstrokes without  bruits.  LUNGS:  Clear.  CARDIAC:  Irregular rhythm; normal 1st and 2nd heart sounds.  ABDOMEN:  Soft and nontender; no organomegaly.  EXTREMITIES:  No edema.   IMPRESSION:  Angie Sullivan is doing beautifully from a medical standpoint.   We will obtain her usual annual laboratories which include CBC,  metabolic profile, and stool for hemoccult testing.  Vaccinations are up  to date.  I will plan to see this nice woman again in 1 year.     Gerrit Friends. Dietrich Pates, MD, [facility]  Electronically Signed    RMR/MedQ  DD: 11/07/2007  DT: 11/07/2007  Job #: [id number]

## 2011-04-10 NOTE — Assessment & Plan Note (Signed)
[place] [facility]                       [place] CARDIOLOGY OFFICE NOTE   NAME:PASCHALRosy, Estabrook                        MRN:          [id number]  DATE:10/08/2008                            DOB:          17-Nov-1934    CARDIOLOGIST:  She is new to Dr. Dietrich Pates.   PRIMARY CARE PHYSICIAN:  Jeannett Senior A. Clent Ridges, MD   REASON FOR VISIT:  One-month followup.   HISTORY OF PRESENT ILLNESS:  Ms. Guthridge is a 77 year old female patient  with a history of permanent atrial fibrillation on chronic Coumadin  therapy who was recently seen by Dr. Dietrich Pates secondary to lower  extremity edema.  She was set up for an echocardiogram and lower  extremity dopplers as well as extensive blood work.  Unfortunately, the  blood work and chest x-ray were never performed.  She did have an  echocardiogram that demonstrated an EF of 50-55%, mild mitral  regurgitation, mildly dilated right ventricle, estimated peak RV  systolic pressure mildly increased, and moderately dilated bilateral  atria.  Her lower extremity Dopplers demonstrated no evidence for DVT.  She does have prominent lymph nodes in both inguinal regions.  Apparently, this prompted further evaluation with a CT of her abdomen  and pelvis by Dr. Clent Ridges.  This demonstrated probable fatty infiltration,  atypical hemangioma of the liver, multiple gallstones within the  gallbladder, incidental right adrenal adenoma, and a few slightly  prominent groin nodes, none of which were pathologically enlarged.  Of  note, her rate was uncontrolled when she saw Dr. Dietrich Pates last, and he  asked her to increase her metoprolol to 50 mg 3 times a day.  However,  the patient notes that she is only taking metoprolol once daily.  She  was apparently recently placed on furosemide.  Since her last visit, she  has lost 11 pounds.  She denies any significant shortness of breath.  She denies chest pain.  She denies orthopnea or PND.  Her pedal edema  seems to be  better.  She denies cough, syncope, or palpitations.   CURRENT MEDICATIONS:  Evista 60 mg daily, Calcium, Coumadin as directed,  Fosamax 70 mg weekly, Furosemide 40 mg daily,  Metoprolol 50 mg 1 tablet daily.   PHYSICAL EXAMINATION:  GENERAL:  She is a well-nourished, well-developed  female in no acute distress.  VITAL SIGNS:  Blood pressure is 104/66, pulse 87, and weight 156 pounds.  HEENT:  Normal.  NECK:  Without JVD.  CARDIAC:  Normal S1 and S2.  Regular rate and rhythm.  LUNGS:  Clear to auscultation bilaterally.  ABDOMEN:  Soft and nontender.  EXTREMITIES:  With 1+ edema bilaterally.  She has multiple excoriations  noted about her bilateral extremities.   Rhythm strip demonstrates atrial fibrillation with a heart rate of 83.   ASSESSMENT/PLAN:  1. Peripheral edema of unknown etiology at this time.  As noted above,      her echocardiogram demonstrated normal left ventricular function      and normal right ventricular function.  She does have some mildly      elevated pressures in her right ventricle as  well as a mildly      dilated right ventricle.  Her lower extremity Dopplers were also      negative.  Unfortunately, her blood work was never performed.      Also, her chest x-ray was never performed.  The recent initiation      of diuretics has contributed to weight loss of about 11 pounds and      her edema seems to be improved.  She was also seen by Dr. Dietrich Pates      today.  We plan to continue her current medical regimen.  We also      plan to arrange again for blood work and chest x-ray as previously      recommended.  She will have a CMET, CBC, and TSH.  She will also      have stool Hemoccults, given her anticoagulation therapy and recent      notation of anemia.  2. Permanent atrial fibrillation on chronic anticoagulation therapy.      Her last INR was 4.4 and adjustments were made by Shelby Dubin in      the Coumadin Clinic.  It does not appear that the patient has  had      followup since that time.  Since we are obtaining labs, a PT and      INR will be checked and these will be forwarded onto the Coumadin      Clinic.  3. Hypertension.  Overall, well controlled.   DISPOSITION:  The patient will be brought back to follow up with Dr.  Dietrich Pates in the next 6 weeks.  Of note, we will bring the patient in  next week with all of her medications.  If she is on a short-acting  metoprolol, we will change her to either twice daily dosing or a long-  acting form.      Tereso Newcomer, PA-C  Electronically Signed      Gerrit Friends. Dietrich Pates, MD, [facility]  Electronically Signed   SW/MedQ  DD: 10/08/2008  DT: 10/09/2008  Job #: [id number]   cc:   Jeannett Senior A. Clent Ridges, MD

## 2011-04-13 NOTE — Assessment & Plan Note (Signed)
[facility] OFFICE NOTE   NAME:Angie Sullivan, Angie Sullivan                        MRN:          [id number]  DATE:07/11/2006                            DOB:          October 16, 1934    This is a 77 year old woman, here for a complete physical examination.  She  is also here for surgical clearance.  She plans to have a cataract removal  on August 20 per Dr. Emmit Pomfret and he would like to make sure she is in good  shape to do it.   In general, she feels fine and has no other complaints at all.  We have been  following her for hypertension and chronic atrial fibrillation, which has  been stable.  She saw Dr. Dorethea Clan on December 31, 2005, and he was quite  satisfied.  He plans on seeing her on a yearly basis.  She continues to have  regular Coumadin checks.  She had a colonoscopy in September of 2006.  This  was normal, except for a few benign polyps.  Further details of her past  medical history, family history, social history, habits, etc., refer to her  last physical note, dated July 06, 2005.   ALLERGIES:  None.   CURRENT MEDICATIONS:  1. Evista 60 mg per day.  2. Calcium and vitamin D daily.  3. Fosamax 70 mg once a week.  4. Metoprolol 50 mg one half tablet twice daily.  5. Coumadin 5 mg per day.   OBJECTIVE:  VITAL SIGNS:  Height 5 feet 8 inches, weight 161, BP 110/80,  pulse 60 and irregularly irregular.  IN GENERAL:  She appears to be doing quite well.  Skin is free of  significant lesions.  HEENT:  Eyes are clear; she wears glasses.  Ears clear.  Oropharynx clear.  NECK:  The neck is supple without lymphadenopathy or masses.  LUNGS:  Clear.  CARDIAC:  Rate is regular, rhythm is irregular.  There are no gallops,  murmurs or rubs.  Distal pulses are full.  EKG:  Shows atrial fibrillation  with a well-controlled ventricular rate.  BREASTS AND AXILLAE:  Clear.  ABDOMEN:  Soft, normal bowel sounds, nontender, no masses.  EXTERNAL GENITALIA:  Mildly atrophic.  PELVIC EXAM:  Not performed.  RECTAL EXAM:  No masses or tenderness.  Stool is Hemoccult negative.  EXTREMITIES:  No clubbing, cyanosis or edema.  NEUROLOGIC EXAM:  Grossly intact.   ASSESSMENT AND PLAN:  1. Complete physical.  She is fasting, so we will check usual      laboratories.  The patient will set up a mammogram for herself this      fall.  2. Chronic atrial fibrillation, stable.  3. Hypertension, stable.  4. Osteoporosis, stable.  5. Upcoming cataract surgery.  I do feel that she is quite medically able      to undergo this procedure with minimal risk.  I did ask that she hold      her Coumadin for three days prior to the surgery and to resume her  Coumadin one day after the surgery.                                   Tera Mater. Clent Ridges, MD   SAF/MedQ  DD:  07/11/2006  DT:  07/11/2006  Job #:  [id number]

## 2011-04-13 NOTE — Assessment & Plan Note (Signed)
[facility]                       [place] CARDIOLOGY OFFICE NOTE   NAME:PASCHALBevelyn, Sullivan                        MRN:          [id number]  DATE:10/29/2006                            DOB:          05/26/1934    REFERRING PHYSICIAN:  Tera Mater. Clent Ridges, MD   [name] returns to the office for continued assessment and treatment  of chronic atrial fibrillation.  Since her last visit, she has continued  to do extraordinarily well.  She has had no acute medical problems, nor  has she required urgent medical care or hospitalization.  She reports no  cardiopulmonary symptoms.  Her principal problem is osteoporosis which  is currently under good medical therapy.  She has never suffered a  fracture.  Blood pressure control has been good.   CURRENT MEDICATIONS:  1. Evista 60 mg every day.  2. Calcium one every day.  3. Warfarin as directed with stable and therapeutic anticoagulation.  4. Fosamax 70 mg every week.  5. Metoprolol 25 mg b.i.d.   PHYSICAL EXAMINATION:  GENERAL:  A pleasant woman in no acute distress.  VITAL SIGNS:  The weight is 162, 6 pounds less than in February.  Blood  pressure 105/80, heart rate 75 and irregular, respirations 14.  NECK:  No jugular venous distention; normal carotid upstrokes without  bruits.  LUNGS:  Clear.  CARDIAC:  Normal first and second heart sounds; irregular rhythm; modest  systolic ejection murmur.  ABDOMEN:  Normal bowel sounds; soft and nontender; no masses nor  organomegaly.  EXTREMITIES:  Trace edema; distal pulses intact.   RECENT LABORATORY:  Generally normal.  Platelet count was slightly  depressed at 139.  Lipids were good.  Chemistry profile revealed a  slightly elevated sodium of 148 and bilirubin of 1.4.   RHYTHM STRIP:  Atrial fibrillation with a controlled ventricular  response.   IMPRESSION:  Angie Sullivan is doing generally well.  Medical therapy  appears appropriate.  We will check stool for  hemoccult due to her  chronic use of Warfarin.  Vaccinations are up to date.   I will plan to see this nice woman again in one year.    Gerrit Friends. Dietrich Pates, MD, [facility]  Electronically Signed   RMR/MedQ  DD: 10/29/2006  DT: 10/29/2006  Job #: [id number]

## 2011-04-13 NOTE — Procedures (Signed)
NAME:  Angie Sullivan, Angie Sullivan NO.:  [id number]   MEDICAL RECORD NO.:  [id number]                   PATIENT TYPE:  OUT   LOCATION:  RAD                                  FACILITY:  [facility]   PHYSICIAN:  Wheeler Bing, M.D.               DATE OF BIRTH:  July 16, 1934   DATE OF PROCEDURE:  06/23/2004  DATE OF DISCHARGE:                                  ECHOCARDIOGRAM   REFERRING PHYSICIANS:  Drs. McInnis and [employer].   CLINICAL DATA:  This is a 77 year old woman with atrial fibrillation.   M-MODE:  1. Aorta 3.1.  2. Left atrium 3.9.  3. Septum 1.0.  4. Posterior wall 1.0.  5. LV diastole 4.3.  6. LV systole 3.3.   1. Technically adequate echocardiographic study.  2. Mild left atrial enlargement; mild to moderate right atrial enlargement.  3. Mild right ventricular dilatation; borderline hypertrophy; low normal to     mildly impaired left ventricular systolic function.  4. Minimal aortic valvular sclerosis.  5. Normal mitral valve; very mild annular calcification; very mild     regurgitation.  6. Normal tricuspid valve; mild regurgitation; normal estimated RV systolic     pressure.  7. Normal pulmonic valve.  8. Normal internal dimension of the left ventricle; no LVH.  Low normal     region and global LV systolic function; slight septal flattening.  9. Normal IVC.      ___________________________________________                                            [place] Bing, M.D.   RR/MEDQ  D:  06/23/2004  T:  06/23/2004  Job:  [id number]

## 2011-05-03 ENCOUNTER — Ambulatory Visit (INDEPENDENT_AMBULATORY_CARE_PROVIDER_SITE_OTHER): Payer: Medicare Other | Admitting: *Deleted

## 2011-05-03 DIAGNOSIS — I4891 Unspecified atrial fibrillation: Secondary | ICD-10-CM

## 2011-05-03 DIAGNOSIS — Z7901 Long term (current) use of anticoagulants: Secondary | ICD-10-CM

## 2011-05-31 ENCOUNTER — Ambulatory Visit (INDEPENDENT_AMBULATORY_CARE_PROVIDER_SITE_OTHER): Payer: Medicare Other | Admitting: *Deleted

## 2011-05-31 DIAGNOSIS — I4891 Unspecified atrial fibrillation: Secondary | ICD-10-CM

## 2011-05-31 DIAGNOSIS — Z7901 Long term (current) use of anticoagulants: Secondary | ICD-10-CM

## 2011-06-13 ENCOUNTER — Ambulatory Visit (INDEPENDENT_AMBULATORY_CARE_PROVIDER_SITE_OTHER): Payer: Medicare Other | Admitting: *Deleted

## 2011-06-13 DIAGNOSIS — Z7901 Long term (current) use of anticoagulants: Secondary | ICD-10-CM

## 2011-06-13 DIAGNOSIS — I4891 Unspecified atrial fibrillation: Secondary | ICD-10-CM

## 2011-07-11 ENCOUNTER — Ambulatory Visit (INDEPENDENT_AMBULATORY_CARE_PROVIDER_SITE_OTHER): Payer: Medicare Other | Admitting: *Deleted

## 2011-07-11 DIAGNOSIS — Z7901 Long term (current) use of anticoagulants: Secondary | ICD-10-CM

## 2011-07-11 DIAGNOSIS — I4891 Unspecified atrial fibrillation: Secondary | ICD-10-CM

## 2011-08-08 ENCOUNTER — Ambulatory Visit (INDEPENDENT_AMBULATORY_CARE_PROVIDER_SITE_OTHER): Payer: Medicare Other | Admitting: *Deleted

## 2011-08-08 DIAGNOSIS — I4891 Unspecified atrial fibrillation: Secondary | ICD-10-CM

## 2011-08-08 DIAGNOSIS — Z7901 Long term (current) use of anticoagulants: Secondary | ICD-10-CM

## 2011-08-08 LAB — POCT INR: INR: 3.2

## 2011-09-05 ENCOUNTER — Ambulatory Visit (INDEPENDENT_AMBULATORY_CARE_PROVIDER_SITE_OTHER): Payer: Medicare Other | Admitting: *Deleted

## 2011-09-05 DIAGNOSIS — Z7901 Long term (current) use of anticoagulants: Secondary | ICD-10-CM

## 2011-09-05 DIAGNOSIS — I4891 Unspecified atrial fibrillation: Secondary | ICD-10-CM

## 2011-09-13 ENCOUNTER — Telehealth: Payer: Self-pay | Admitting: Family Medicine

## 2011-09-13 ENCOUNTER — Encounter: Payer: Self-pay | Admitting: Family Medicine

## 2011-09-13 NOTE — Telephone Encounter (Signed)
Pts daughter called and would like to discuss some things with Dr Clent Ridges prior to her mothers cpx on Monday 09/17/11. Pls call asap.

## 2011-09-13 NOTE — Telephone Encounter (Signed)
I tried to call pt and no answer or option to leave a message. 

## 2011-09-17 ENCOUNTER — Encounter: Payer: Self-pay | Admitting: Family Medicine

## 2011-09-17 ENCOUNTER — Ambulatory Visit (INDEPENDENT_AMBULATORY_CARE_PROVIDER_SITE_OTHER): Payer: Medicare Other | Admitting: Family Medicine

## 2011-09-17 VITALS — BP 110/78 | HR 92 | Temp 98.5°F | Ht 66.75 in | Wt 159.0 lb

## 2011-09-17 DIAGNOSIS — Z79899 Other long term (current) drug therapy: Secondary | ICD-10-CM

## 2011-09-17 DIAGNOSIS — Z7901 Long term (current) use of anticoagulants: Secondary | ICD-10-CM

## 2011-09-17 DIAGNOSIS — Z23 Encounter for immunization: Secondary | ICD-10-CM

## 2011-09-17 DIAGNOSIS — Z0000 Encounter for general adult medical examination without abnormal findings: Secondary | ICD-10-CM

## 2011-09-17 DIAGNOSIS — Z136 Encounter for screening for cardiovascular disorders: Secondary | ICD-10-CM

## 2011-09-17 LAB — CBC WITH DIFFERENTIAL/PLATELET
Basophils Relative: 0.4 % (ref 0.0–3.0)
Eosinophils Relative: 3 % (ref 0.0–5.0)
HCT: 39.9 % (ref 36.0–46.0)
Lymphs Abs: 1.4 10*3/uL (ref 0.7–4.0)
MCV: 94.6 fl (ref 78.0–100.0)
Monocytes Absolute: 0.3 10*3/uL (ref 0.1–1.0)
Monocytes Relative: 6.8 % (ref 3.0–12.0)
Neutrophils Relative %: 57.9 % (ref 43.0–77.0)
Platelets: 158 10*3/uL (ref 150.0–400.0)
RBC: 4.22 Mil/uL (ref 3.87–5.11)
WBC: 4.3 10*3/uL — ABNORMAL LOW (ref 4.5–10.5)

## 2011-09-17 LAB — POCT URINALYSIS DIPSTICK
Bilirubin, UA: NEGATIVE
Blood, UA: NEGATIVE
Glucose, UA: NEGATIVE
Ketones, UA: NEGATIVE
Spec Grav, UA: 1.01

## 2011-09-17 LAB — LIPID PANEL
HDL: 60.8 mg/dL (ref >39.00)
Total CHOL/HDL Ratio: 4
Triglycerides: 117 mg/dL (ref 0.0–149.0)
VLDL: 23.4 mg/dL (ref 0.0–40.0)

## 2011-09-17 LAB — HEPATIC FUNCTION PANEL
Alkaline Phosphatase: 42 U/L (ref 39–117)
Bilirubin, Direct: 0.1 mg/dL (ref 0.0–0.3)
Total Bilirubin: 1.9 mg/dL — ABNORMAL HIGH (ref 0.3–1.2)

## 2011-09-17 LAB — BASIC METABOLIC PANEL
CO2: 31 mEq/L (ref 19–32)
Calcium: 8.9 mg/dL (ref 8.4–10.5)
Creatinine, Ser: 0.7 mg/dL (ref 0.4–1.2)
GFR: 92.64 mL/min (ref >60.00)
Sodium: 142 mEq/L (ref 135–145)

## 2011-09-17 LAB — TSH: TSH: 0.49 u[IU]/mL (ref 0.35–5.50)

## 2011-09-17 LAB — LDL CHOLESTEROL, DIRECT: Direct LDL: 137.7 mg/dL

## 2011-09-17 MED ORDER — DONEPEZIL HCL 5 MG PO TABS: 5.0000 mg | ORAL_TABLET | Freq: Every evening | ORAL | Status: DC | PRN

## 2011-09-17 MED ORDER — FUROSEMIDE 40 MG PO TABS: 40.0000 mg | ORAL_TABLET | Freq: Every day | ORAL | Status: DC

## 2011-09-17 MED ORDER — RALOXIFENE HCL 60 MG PO TABS: 60.0000 mg | ORAL_TABLET | Freq: Every day | ORAL | Status: DC

## 2011-09-17 MED ORDER — METOPROLOL TARTRATE 50 MG PO TABS: 50.0000 mg | ORAL_TABLET | Freq: Two times a day (BID) | ORAL | Status: DC

## 2011-09-17 MED ORDER — WARFARIN SODIUM 5 MG PO TABS: 5.0000 mg | ORAL_TABLET | ORAL | Status: DC

## 2011-09-17 NOTE — Progress Notes (Signed)
  Subjective:    Patient ID: Angie Sullivan, female    DOB: May 27, 1934, 77 y.o.   MRN: [id number]  HPI 77 yr old female with her daughter for a cpx. She feels fine, and neither of them have any concerns. They ask if i can take over writing for her Aricept rather than going back to see Dr. Anne Hahn. She has  Been on this for a year and a half, and it seems to work well for her. Her appetite is good.    Review of Systems  Constitutional: Negative.   HENT: Negative.   Eyes: Negative.   Respiratory: Negative.   Cardiovascular: Negative.   Gastrointestinal: Negative.   Genitourinary: Negative for dysuria, urgency, frequency, hematuria, flank pain, decreased urine volume, enuresis, difficulty urinating, pelvic pain and dyspareunia.  Musculoskeletal: Negative.   Skin: Negative.   Neurological: Negative.   Hematological: Negative.   Psychiatric/Behavioral: Negative.        Objective:   Physical Exam  Constitutional: She is oriented to person, place, and time. She appears well-developed and well-nourished. No distress.  HENT:  Head: Normocephalic and atraumatic.  Right Ear: External ear normal.  Left Ear: External ear normal.  Nose: Nose normal.  Mouth/Throat: Oropharynx is clear and moist. No oropharyngeal exudate.  Eyes: Conjunctivae and EOM are normal. Pupils are equal, round, and reactive to light. No scleral icterus.  Neck: Normal range of motion. Neck supple. No JVD present. No thyromegaly present.  Cardiovascular: Normal rate, normal heart sounds and intact distal pulses.  Exam reveals no gallop and no friction rub.   No murmur heard.      Irregular rhythm. EKG is at her baseline with atrial  fib   Pulmonary/Chest: Effort normal and breath sounds normal. No respiratory distress. She has no wheezes. She has no rales. She exhibits no tenderness.  Abdominal: Soft. Bowel sounds are normal. She exhibits no distension and no mass. There is no tenderness. There is no rebound and no guarding.   Musculoskeletal: Normal range of motion. She exhibits no edema and no tenderness.  Lymphadenopathy:    She has no cervical adenopathy.  Neurological: She is alert and oriented to person, place, and time. She has normal reflexes. No cranial nerve deficit. She exhibits normal muscle tone. Coordination normal.  Skin: Skin is warm and dry. No rash noted. No erythema.  Psychiatric: She has a normal mood and affect. Her behavior is normal. Judgment and thought content normal.          Assessment & Plan:  Well exam. Get fasting labs. All meds were refilled.

## 2011-09-19 ENCOUNTER — Encounter: Payer: Self-pay | Admitting: Family Medicine

## 2011-09-19 ENCOUNTER — Telehealth: Payer: Self-pay | Admitting: Family Medicine

## 2011-09-19 MED ORDER — POTASSIUM CHLORIDE 10 MEQ PO TBCR: 10.0000 meq | EXTENDED_RELEASE_TABLET | Freq: Every day | ORAL | Status: DC

## 2011-09-19 NOTE — Telephone Encounter (Signed)
Message copied by Baldemar Friday on Wed Sep 19, 2011  5:55 PM ------      Message from: Gershon Crane A      Created: Tue Sep 18, 2011  8:43 AM       Normal except low potassium. Call in Klor-con 10 mEq daily for one year

## 2011-09-19 NOTE — Progress Notes (Signed)
Addended by: Aniceto Boss A on: 09/19/2011 05:55 PM   Modules accepted: Orders

## 2011-09-19 NOTE — Telephone Encounter (Signed)
Spoke with pt, sent script e-scribe and put a copy of labs in mail

## 2011-10-01 ENCOUNTER — Encounter: Payer: Medicare Other | Admitting: Family Medicine

## 2011-10-03 ENCOUNTER — Encounter: Payer: Medicare Other | Admitting: *Deleted

## 2011-10-03 ENCOUNTER — Ambulatory Visit (INDEPENDENT_AMBULATORY_CARE_PROVIDER_SITE_OTHER): Payer: Medicare Other | Admitting: *Deleted

## 2011-10-03 DIAGNOSIS — I4891 Unspecified atrial fibrillation: Secondary | ICD-10-CM

## 2011-10-03 DIAGNOSIS — Z7901 Long term (current) use of anticoagulants: Secondary | ICD-10-CM

## 2011-10-03 LAB — POCT INR: INR: 1.5

## 2011-10-22 ENCOUNTER — Encounter: Payer: Medicare Other | Admitting: *Deleted

## 2011-10-23 ENCOUNTER — Encounter: Payer: Self-pay | Admitting: Family Medicine

## 2011-10-23 ENCOUNTER — Ambulatory Visit (INDEPENDENT_AMBULATORY_CARE_PROVIDER_SITE_OTHER): Payer: Medicare Other | Admitting: Family Medicine

## 2011-10-23 VITALS — BP 110/68 | HR 85 | Temp 98.6°F | Wt 162.0 lb

## 2011-10-23 DIAGNOSIS — M545 Low back pain: Secondary | ICD-10-CM

## 2011-10-23 MED ORDER — HYDROCODONE-ACETAMINOPHEN 5-325 MG PO TABS: 1.0000 | ORAL_TABLET | Freq: Four times a day (QID) | ORAL | Status: AC | PRN

## 2011-10-23 NOTE — Progress Notes (Signed)
  Subjective:    Patient ID: Angie Sullivan, female    DOB: May 25, 1934, 77 y.o.   MRN: [id number]  HPI Here complaining of lower back pain after a fall one week ago. 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 No LOC or head trauma. She has had back pain since then. The pain is slowly getting better. Using Tylenol.    Review of Systems  Constitutional: Negative.   Respiratory: Negative.   Cardiovascular: Negative.   Musculoskeletal: Positive for back pain.       Objective:   Physical Exam  Constitutional: She appears well-nourished.  Cardiovascular: Normal rate, regular rhythm, normal heart sounds and intact distal pulses.   Pulmonary/Chest: Effort normal and breath sounds normal.  Musculoskeletal:       Mildly tender in the lower back, full ROM           Assessment & Plan:  Use Vicodin and heat prn

## 2011-11-14 ENCOUNTER — Ambulatory Visit (INDEPENDENT_AMBULATORY_CARE_PROVIDER_SITE_OTHER): Payer: Medicare Other | Admitting: *Deleted

## 2011-11-14 DIAGNOSIS — I4891 Unspecified atrial fibrillation: Secondary | ICD-10-CM

## 2011-11-14 DIAGNOSIS — Z7901 Long term (current) use of anticoagulants: Secondary | ICD-10-CM

## 2011-12-12 ENCOUNTER — Ambulatory Visit (INDEPENDENT_AMBULATORY_CARE_PROVIDER_SITE_OTHER): Payer: Medicare Other | Admitting: *Deleted

## 2011-12-12 DIAGNOSIS — I4891 Unspecified atrial fibrillation: Secondary | ICD-10-CM

## 2011-12-12 DIAGNOSIS — Z7901 Long term (current) use of anticoagulants: Secondary | ICD-10-CM

## 2011-12-12 LAB — POCT INR: INR: 2.1

## 2012-01-09 ENCOUNTER — Ambulatory Visit (INDEPENDENT_AMBULATORY_CARE_PROVIDER_SITE_OTHER): Payer: Medicare Other | Admitting: *Deleted

## 2012-01-09 DIAGNOSIS — I4891 Unspecified atrial fibrillation: Secondary | ICD-10-CM

## 2012-01-09 DIAGNOSIS — Z7901 Long term (current) use of anticoagulants: Secondary | ICD-10-CM

## 2012-01-09 LAB — POCT INR: INR: 2.3

## 2012-02-06 ENCOUNTER — Ambulatory Visit (INDEPENDENT_AMBULATORY_CARE_PROVIDER_SITE_OTHER): Payer: Medicare Other | Admitting: *Deleted

## 2012-02-06 DIAGNOSIS — Z7901 Long term (current) use of anticoagulants: Secondary | ICD-10-CM

## 2012-02-06 DIAGNOSIS — I4891 Unspecified atrial fibrillation: Secondary | ICD-10-CM

## 2012-02-06 LAB — POCT INR: INR: 2

## 2012-03-19 ENCOUNTER — Ambulatory Visit (INDEPENDENT_AMBULATORY_CARE_PROVIDER_SITE_OTHER): Payer: Medicare Other | Admitting: *Deleted

## 2012-03-19 DIAGNOSIS — Z7901 Long term (current) use of anticoagulants: Secondary | ICD-10-CM

## 2012-03-19 DIAGNOSIS — I4891 Unspecified atrial fibrillation: Secondary | ICD-10-CM

## 2012-03-26 ENCOUNTER — Ambulatory Visit (INDEPENDENT_AMBULATORY_CARE_PROVIDER_SITE_OTHER): Payer: Medicare Other | Admitting: *Deleted

## 2012-03-26 DIAGNOSIS — I4891 Unspecified atrial fibrillation: Secondary | ICD-10-CM

## 2012-03-26 DIAGNOSIS — Z7901 Long term (current) use of anticoagulants: Secondary | ICD-10-CM

## 2012-04-10 ENCOUNTER — Ambulatory Visit (INDEPENDENT_AMBULATORY_CARE_PROVIDER_SITE_OTHER): Payer: Medicare Other | Admitting: *Deleted

## 2012-04-10 DIAGNOSIS — I4891 Unspecified atrial fibrillation: Secondary | ICD-10-CM

## 2012-04-10 DIAGNOSIS — Z7901 Long term (current) use of anticoagulants: Secondary | ICD-10-CM

## 2012-04-24 ENCOUNTER — Ambulatory Visit (INDEPENDENT_AMBULATORY_CARE_PROVIDER_SITE_OTHER): Payer: Medicare Other | Admitting: *Deleted

## 2012-04-24 DIAGNOSIS — Z7901 Long term (current) use of anticoagulants: Secondary | ICD-10-CM

## 2012-04-24 DIAGNOSIS — I4891 Unspecified atrial fibrillation: Secondary | ICD-10-CM

## 2012-04-24 LAB — POCT INR: INR: 2.1

## 2012-05-06 ENCOUNTER — Ambulatory Visit (INDEPENDENT_AMBULATORY_CARE_PROVIDER_SITE_OTHER): Payer: Medicare Other | Admitting: Family Medicine

## 2012-05-06 ENCOUNTER — Encounter: Payer: Self-pay | Admitting: Family Medicine

## 2012-05-06 ENCOUNTER — Telehealth: Payer: Self-pay | Admitting: Family Medicine

## 2012-05-06 VITALS — BP 110/70 | HR 62 | Temp 98.6°F | Wt 149.0 lb

## 2012-05-06 DIAGNOSIS — H8190 Unspecified disorder of vestibular function, unspecified ear: Secondary | ICD-10-CM

## 2012-05-06 MED ORDER — METHYLPREDNISOLONE ACETATE 80 MG/ML IJ SUSP
120.0000 mg | Freq: Once | INTRAMUSCULAR | Status: AC
  Administered 2012-05-06: 120 mg via INTRAMUSCULAR

## 2012-05-06 NOTE — Telephone Encounter (Signed)
Caller: Burnett Corrente; PCP: Nelwyn Salisbury.; CB#: [phone]; ; ; Call regarding Having Dizziness for Last Few Days Not Getting Better.  Has dizziness and vertigo; requires support to work.  States vomited 2-3 times 05/04/12 but not now.  Afebrile.  Per protocol, advised appt within 4 hours; appt sched 1530 05/06/12 with Dr. Fabian Sharp.

## 2012-05-06 NOTE — Telephone Encounter (Signed)
Pt is now on our schedule for today.

## 2012-05-06 NOTE — Progress Notes (Signed)
  Subjective:    Patient ID: Angie Sullivan, female    DOB: 1934-04-11, 78 y.o.   MRN: [id number]  HPI Here for 3 days of dizziness, which she describes as the room spinning around her. No HA or sinus pressure. She has been nauseated and has vomited twice. No ST or cough or fever. No vision changes. No other neurologic deficits.    Review of Systems  Constitutional: Negative.   HENT: Negative.   Eyes: Negative.   Respiratory: Negative.   Cardiovascular: Negative.   Gastrointestinal: Negative.   Neurological: Positive for dizziness. Negative for tremors, seizures, syncope, facial asymmetry, speech difficulty, weakness, light-headedness, numbness and headaches.       Objective:   Physical Exam  Constitutional: She is oriented to person, place, and time. She appears well-developed and well-nourished.       Slightly unsteady gait, alert  HENT:  Right Ear: External ear normal.  Left Ear: External ear normal.  Nose: Nose normal.  Mouth/Throat: Oropharynx is clear and moist. No oropharyngeal exudate.  Eyes: Conjunctivae and EOM are normal. Pupils are equal, round, and reactive to light.  Neck: Normal range of motion. Neck supple. No thyromegaly present.  Cardiovascular: Normal rate, regular rhythm, normal heart sounds and intact distal pulses.   Pulmonary/Chest: Effort normal and breath sounds normal.  Lymphadenopathy:    She has no cervical adenopathy.  Neurological: She is alert and oriented to person, place, and time. She has normal reflexes. No cranial nerve deficit. She exhibits normal muscle tone. Coordination normal.          Assessment & Plan:  Drink fluids, take Claritin daily prn

## 2012-05-15 ENCOUNTER — Ambulatory Visit (INDEPENDENT_AMBULATORY_CARE_PROVIDER_SITE_OTHER): Payer: Medicare Other | Admitting: *Deleted

## 2012-05-15 DIAGNOSIS — Z7901 Long term (current) use of anticoagulants: Secondary | ICD-10-CM

## 2012-05-15 DIAGNOSIS — I4891 Unspecified atrial fibrillation: Secondary | ICD-10-CM

## 2012-05-15 LAB — POCT INR: INR: 2.8

## 2012-05-23 ENCOUNTER — Telehealth: Payer: Self-pay | Admitting: Family Medicine

## 2012-05-23 NOTE — Telephone Encounter (Signed)
Caller: Lyn/Child; PCP: Nelwyn Salisbury.; CB#: [phone]; Call regarding Confusion; daughter states that pt has  hx of dementia and she is on Aricept 5mg  qd; increase in confusion started approx in the last 8-12 months; it has been gradual; been getting worse in the last couple of months; Triaged per Dementia Guideline; Call Provider within 24 hr d/t new or worsening sx and taking medications as prescribed; daughter requests note to be sent to office to see if MD would like to make any medication adjustment prior to office visit; daughter requested OV for Wednesday, May 28, 2012; office called for an appt but will have MD review today since MD will be on vacation next week;  OFFICE PLEASE REVIEW AND CALL DAUGHTER WITH  FURTHER INSTRUCTIONS

## 2012-05-23 NOTE — Telephone Encounter (Signed)
I left voice message for pt to call back and schedule a office visit.

## 2012-05-23 NOTE — Telephone Encounter (Signed)
I don't have recommendations at this time. I will see them at the OV

## 2012-06-23 ENCOUNTER — Ambulatory Visit (INDEPENDENT_AMBULATORY_CARE_PROVIDER_SITE_OTHER): Payer: Medicare Other | Admitting: Family Medicine

## 2012-06-23 ENCOUNTER — Encounter: Payer: Self-pay | Admitting: Family Medicine

## 2012-06-23 VITALS — BP 110/70 | HR 76 | Temp 98.4°F | Wt <= 1120 oz

## 2012-06-23 DIAGNOSIS — F0390 Unspecified dementia without behavioral disturbance: Secondary | ICD-10-CM

## 2012-06-23 DIAGNOSIS — I8390 Asymptomatic varicose veins of unspecified lower extremity: Secondary | ICD-10-CM

## 2012-06-23 DIAGNOSIS — I8393 Asymptomatic varicose veins of bilateral lower extremities: Secondary | ICD-10-CM

## 2012-06-23 DIAGNOSIS — IMO0002 Reserved for concepts with insufficient information to code with codable children: Secondary | ICD-10-CM

## 2012-06-23 DIAGNOSIS — S80819A Abrasion, unspecified lower leg, initial encounter: Secondary | ICD-10-CM

## 2012-06-23 MED ORDER — DONEPEZIL HCL 10 MG PO TABS: 10.0000 mg | ORAL_TABLET | Freq: Every evening | ORAL | Status: DC | PRN

## 2012-06-23 NOTE — Progress Notes (Signed)
  Subjective:    Patient ID: Angie Sullivan, female    DOB: Nov 29, 1933, 78 y.o.   MRN: [id number]  HPI Here with her daughter to check the right lower leg and to discuss her Aricept. She apparently bumped her leg on something last week and it became red. No bleeding. They have been applying Neosporin daily, and they say it is looking better. It is not painful. Also her dementia has progressed a bit, and her memory has diminished. No great behavioral changes.    Review of Systems  Constitutional: Negative.   Skin: Positive for color change.  Neurological: Negative.   Psychiatric/Behavioral: Negative.        Objective:   Physical Exam  Constitutional: She appears well-developed and well-nourished.  Cardiovascular: Intact distal pulses.   Neurological: She is alert.       Oriented to self only   Skin:       The right lower leg is dusky and has a scabbed area which is healing. She has numerous venous varicosities. Not red or hot or tender.   Psychiatric: She has a normal mood and affect. Her behavior is normal. Thought content normal.          Assessment & Plan:  We will increase the Aricept to 10 mg a day. The leg wound looks good and they will continue current measures.

## 2012-09-17 ENCOUNTER — Encounter: Payer: Self-pay | Admitting: *Deleted

## 2012-09-20 ENCOUNTER — Other Ambulatory Visit: Payer: Self-pay | Admitting: Family Medicine

## 2012-09-21 ENCOUNTER — Other Ambulatory Visit: Payer: Self-pay | Admitting: Family Medicine

## 2012-09-26 ENCOUNTER — Other Ambulatory Visit: Payer: Self-pay

## 2012-09-26 NOTE — Addendum Note (Signed)
Addended by: Kyung Rudd on: 09/26/2012 10:07 AM   Modules accepted: Orders

## 2012-10-01 ENCOUNTER — Ambulatory Visit (INDEPENDENT_AMBULATORY_CARE_PROVIDER_SITE_OTHER): Payer: Medicare Other | Admitting: *Deleted

## 2012-10-01 DIAGNOSIS — Z7901 Long term (current) use of anticoagulants: Secondary | ICD-10-CM

## 2012-10-01 DIAGNOSIS — I4891 Unspecified atrial fibrillation: Secondary | ICD-10-CM

## 2012-10-01 LAB — POCT INR: INR: 3.1

## 2012-10-27 ENCOUNTER — Other Ambulatory Visit: Payer: Self-pay | Admitting: Family Medicine

## 2012-10-29 ENCOUNTER — Ambulatory Visit (INDEPENDENT_AMBULATORY_CARE_PROVIDER_SITE_OTHER): Payer: Medicare Other | Admitting: *Deleted

## 2012-10-29 DIAGNOSIS — I4891 Unspecified atrial fibrillation: Secondary | ICD-10-CM

## 2012-10-29 DIAGNOSIS — Z7901 Long term (current) use of anticoagulants: Secondary | ICD-10-CM

## 2012-10-29 LAB — POCT INR: INR: 1.9

## 2012-10-31 ENCOUNTER — Encounter: Payer: Self-pay | Admitting: Family Medicine

## 2012-10-31 ENCOUNTER — Ambulatory Visit (INDEPENDENT_AMBULATORY_CARE_PROVIDER_SITE_OTHER): Payer: Medicare Other | Admitting: Family Medicine

## 2012-10-31 ENCOUNTER — Ambulatory Visit (INDEPENDENT_AMBULATORY_CARE_PROVIDER_SITE_OTHER)
Admission: RE | Admit: 2012-10-31 | Discharge: 2012-10-31 | Disposition: A | Discharge status: Home / Self Care | Payer: Medicare Other | Source: Ambulatory Visit | Attending: Family Medicine | Admitting: Family Medicine

## 2012-10-31 VITALS — BP 108/66 | HR 78 | Temp 98.4°F | Ht 64.5 in | Wt 137.0 lb

## 2012-10-31 DIAGNOSIS — Z0000 Encounter for general adult medical examination without abnormal findings: Secondary | ICD-10-CM

## 2012-10-31 DIAGNOSIS — Z23 Encounter for immunization: Secondary | ICD-10-CM

## 2012-10-31 DIAGNOSIS — I4891 Unspecified atrial fibrillation: Secondary | ICD-10-CM

## 2012-10-31 LAB — BASIC METABOLIC PANEL
BUN: 16 mg/dL (ref 6–23)
Chloride: 101 mEq/L (ref 96–112)
Glucose, Bld: 98 mg/dL (ref 70–99)
Potassium: 3.5 mEq/L (ref 3.5–5.1)

## 2012-10-31 LAB — HEPATIC FUNCTION PANEL
ALT: 13 U/L (ref 0–35)
AST: 22 U/L (ref 0–37)
Total Bilirubin: 0.9 mg/dL (ref 0.3–1.2)

## 2012-10-31 LAB — POCT URINALYSIS DIPSTICK
Bilirubin, UA: NEGATIVE
Blood, UA: NEGATIVE
Nitrite, UA: NEGATIVE
Spec Grav, UA: 1.015
Urobilinogen, UA: 0.2
pH, UA: 7.5

## 2012-10-31 LAB — CBC WITH DIFFERENTIAL/PLATELET
Basophils Relative: 0.4 % (ref 0.0–3.0)
Eosinophils Absolute: 0.1 10*3/uL (ref 0.0–0.7)
HCT: 39.8 % (ref 36.0–46.0)
Lymphs Abs: 1.5 10*3/uL (ref 0.7–4.0)
MCHC: 32.7 g/dL (ref 30.0–36.0)
MCV: 94.9 fl (ref 78.0–100.0)
Monocytes Absolute: 0.3 10*3/uL (ref 0.1–1.0)
Neutrophils Relative %: 63.6 % (ref 43.0–77.0)
RBC: 4.2 Mil/uL (ref 3.87–5.11)

## 2012-10-31 LAB — LIPID PANEL: VLDL: 22.6 mg/dL (ref 0.0–40.0)

## 2012-10-31 MED ORDER — POTASSIUM CHLORIDE CRYS ER 10 MEQ PO TBCR: 10.0000 meq | EXTENDED_RELEASE_TABLET | Freq: Every day | ORAL | Status: DC

## 2012-10-31 MED ORDER — RALOXIFENE HCL 60 MG PO TABS: 60.0000 mg | ORAL_TABLET | Freq: Every day | ORAL | Status: DC

## 2012-10-31 MED ORDER — FUROSEMIDE 40 MG PO TABS: 40.0000 mg | ORAL_TABLET | Freq: Every day | ORAL | Status: DC

## 2012-10-31 MED ORDER — METOPROLOL TARTRATE 50 MG PO TABS: 50.0000 mg | ORAL_TABLET | Freq: Two times a day (BID) | ORAL | Status: DC

## 2012-10-31 NOTE — Addendum Note (Signed)
Addended by: Aniceto Boss A on: 10/31/2012 10:15 AM   Modules accepted: Orders

## 2012-10-31 NOTE — Progress Notes (Signed)
  Subjective:    Patient ID: Angie Sullivan, female    DOB: 1934/06/19, 78 y.o.   MRN: [id number]  HPI 78 yr old female for a cpx. Here with her daughter. She has done well on the higher dose of Aricept with less confusion, though she is still quite forgetful. No other concerns. She will be going to an adult day care program in a few weeks, and they need some sort of TB testing. She has not had a CXR for over 4 years. She is to see Cardiology in a few weeks.    Review of Systems  Constitutional: Negative.   HENT: Negative.   Eyes: Negative.   Respiratory: Negative.   Cardiovascular: Negative.   Gastrointestinal: Negative.   Genitourinary: Negative for dysuria, urgency, frequency, hematuria, flank pain, decreased urine volume, enuresis, difficulty urinating, pelvic pain and dyspareunia.  Musculoskeletal: Negative.   Skin: Negative.   Neurological: Negative.   Hematological: Negative.   Psychiatric/Behavioral: Negative.        Objective:   Physical Exam  Constitutional: She is oriented to person, place, and time. She appears well-developed and well-nourished. No distress.  HENT:  Head: Normocephalic and atraumatic.  Right Ear: External ear normal.  Left Ear: External ear normal.  Nose: Nose normal.  Mouth/Throat: Oropharynx is clear and moist. No oropharyngeal exudate.  Eyes: Conjunctivae normal and EOM are normal. Pupils are equal, round, and reactive to light. No scleral icterus.  Neck: Normal range of motion. Neck supple. No JVD present. No thyromegaly present.  Cardiovascular: Normal rate, normal heart sounds and intact distal pulses.  Exam reveals no gallop and no friction rub.   No murmur heard.      Irregular rhythm   Pulmonary/Chest: Effort normal and breath sounds normal. No respiratory distress. She has no wheezes. She has no rales. She exhibits no tenderness.  Abdominal: Soft. Bowel sounds are normal. She exhibits no distension and no mass. There is no tenderness. There  is no rebound and no guarding.  Musculoskeletal: Normal range of motion. She exhibits no edema and no tenderness.  Lymphadenopathy:    She has no cervical adenopathy.  Neurological: She is alert and oriented to person, place, and time. She has normal reflexes. No cranial nerve deficit. She exhibits normal muscle tone. Coordination normal.  Skin: Skin is warm and dry. No rash noted. No erythema.  Psychiatric: She has a normal mood and affect. Her behavior is normal. Judgment and thought content normal.          Assessment & Plan:  Well exam. Get a CXR today. Get labs.

## 2012-10-31 NOTE — Progress Notes (Signed)
Quick Note:  I left voice message with results. ______ 

## 2012-11-05 NOTE — Progress Notes (Signed)
Quick Note:  I left voice message with results. ______ 

## 2012-11-12 ENCOUNTER — Ambulatory Visit: Payer: Medicare Other | Admitting: Cardiology

## 2012-11-17 ENCOUNTER — Ambulatory Visit (INDEPENDENT_AMBULATORY_CARE_PROVIDER_SITE_OTHER): Payer: Medicare Other | Admitting: Adult Health

## 2012-11-17 ENCOUNTER — Encounter: Payer: Self-pay | Admitting: Adult Health

## 2012-11-17 VITALS — BP 125/79 | HR 65 | Ht 68.0 in | Wt 139.2 lb

## 2012-11-17 DIAGNOSIS — I4891 Unspecified atrial fibrillation: Secondary | ICD-10-CM

## 2012-11-17 DIAGNOSIS — E785 Hyperlipidemia, unspecified: Secondary | ICD-10-CM

## 2012-11-17 DIAGNOSIS — I10 Essential (primary) hypertension: Secondary | ICD-10-CM

## 2012-11-17 NOTE — Progress Notes (Deleted)
Name: Angie Sullivan    DOB: Sep 23, 1934  Age: 78 y.o.  MR#: [id number]       PCP:  Nelwyn Salisbury, MD      Insurance: @PAYORNAME @   CC:   No chief complaint on file.   VS BP 125/79  Pulse 65  Ht 5\' 8"  (1.727 m)  Wt 139 lb 4 oz (63.163 kg)  BMI 21.17 kg/m2  SpO2 94%  Weights Current Weight  11/17/12 139 lb 4 oz (63.163 kg)  10/31/12 137 lb (62.143 kg)  06/23/12 43 lb (19.505 kg)    Blood Pressure  BP Readings from Last 3 Encounters:  11/17/12 125/79  10/31/12 108/66  06/23/12 110/70     Admit date:  (Not on file) Last encounter with RMR:  Visit date not found   Allergy No Known Allergies  Current Outpatient Prescriptions  Medication Sig Dispense Refill  . donepezil (ARICEPT) 10 MG tablet Take 1 tablet (10 mg total) by mouth at bedtime as needed.  30 tablet  11  . furosemide (LASIX) 40 MG tablet Take 1 tablet (40 mg total) by mouth daily.  90 tablet  3  . metoprolol (LOPRESSOR) 50 MG tablet Take 1 tablet (50 mg total) by mouth 2 (two) times daily.  180 tablet  3  . potassium chloride (KLOR-CON M10) 10 MEQ tablet Take 1 tablet (10 mEq total) by mouth daily.  90 tablet  3  . raloxifene (EVISTA) 60 MG tablet Take 1 tablet (60 mg total) by mouth daily.  90 tablet  3  . warfarin (COUMADIN) 5 MG tablet Take 5 mg by mouth as directed. Take 1 tablet every day and then on Mon, Wed, Fri take 1/2 tablet      . warfarin (COUMADIN) 5 MG tablet TAKE ONE TABLET BY MOUTH ON MONDAY, WEDNESDAY, AND FRIDAY, THEN TAKE ONE-HALF TABLET ON TUESDAY, THURSDAY, SATURDAY, AND SUNDAY  30 tablet  0  . [DISCONTINUED] potassium chloride (KLOR-CON 10) 10 MEQ CR tablet Take 1 tablet (10 mEq total) by mouth daily.  30 tablet  11    Discontinued Meds:   There are no discontinued medications.  Patient Active Problem List  Diagnosis  . HYPERLIPIDEMIA  . HYPERTENSION  . ATRIAL FIBRILLATION  . CELLULITIS, LEGS  . OSTEOPOROSIS  . LEG EDEMA  . LYMPHADENOPATHY  . Long term current use of anticoagulant     LABS Office Visit on 10/31/2012  Component Date Value  . Cholesterol 10/31/2012 210*  . Triglycerides 10/31/2012 113.0   . HDL 10/31/2012 56.70   . VLDL 10/31/2012 22.6   . Total CHOL/HDL Ratio 10/31/2012 4   . Sodium 10/31/2012 140   . Potassium 10/31/2012 3.5   . Chloride 10/31/2012 101   . CO2 10/31/2012 33*  . Glucose, Bld 10/31/2012 98   . BUN 10/31/2012 16   . Creatinine, Ser 10/31/2012 0.7   . Calcium 10/31/2012 9.0   . GFR 10/31/2012 90.78   . Total Bilirubin 10/31/2012 0.9   . Bilirubin, Direct 10/31/2012 0.1   . Alkaline Phosphatase 10/31/2012 52   . AST 10/31/2012 22   . ALT 10/31/2012 13   . Total Protein 10/31/2012 7.0   . Albumin 10/31/2012 3.7   . Color, UA 10/31/2012 yellow   . Clarity, UA 10/31/2012 clear   . Glucose, UA 10/31/2012 n   . Bilirubin, UA 10/31/2012 n   . Ketones, UA 10/31/2012 n   . Spec Grav, UA 10/31/2012 1.015   . Blood, UA 10/31/2012 n   .  pH, UA 10/31/2012 7.5   . Protein, UA 10/31/2012 n   . Urobilinogen, UA 10/31/2012 0.2   . Nitrite, UA 10/31/2012 n   . Leukocytes, UA 10/31/2012 small (1+)   . TSH 10/31/2012 0.93   . WBC 10/31/2012 5.1   . RBC 10/31/2012 4.20   . Hemoglobin 10/31/2012 13.0   . HCT 10/31/2012 39.8   . MCV 10/31/2012 94.9   . MCHC 10/31/2012 32.7   . RDW 10/31/2012 14.3   . Platelets 10/31/2012 192.0   . Neutrophils Relative 10/31/2012 63.6   . Lymphocytes Relative 10/31/2012 28.5   . Monocytes Relative 10/31/2012 6.4   . Eosinophils Relative 10/31/2012 1.1   . Basophils Relative 10/31/2012 0.4   . Neutro Abs 10/31/2012 3.2   . Lymphs Abs 10/31/2012 1.5   . Monocytes Absolute 10/31/2012 0.3   . Eosinophils Absolute 10/31/2012 0.1   . Basophils Absolute 10/31/2012 0.0   . Direct LDL 10/31/2012 135.3   Anti-coag visit on 10/29/2012  Component Date Value  . INR 10/29/2012 1.9   Anti-coag visit on 10/01/2012  Component Date Value  . INR 10/01/2012 3.1      Results for this Opt Visit:     Results  for orders placed in visit on 10/31/12  LIPID PANEL      Component Value Range   Cholesterol 210 (*) 0 - 200 mg/dL   Triglycerides 161.0  0.0 - 149.0 mg/dL   HDL 96.04  >54.09 mg/dL   VLDL 81.1  0.0 - 91.4 mg/dL   Total CHOL/HDL Ratio 4    BASIC METABOLIC PANEL      Component Value Range   Sodium 140  135 - 145 mEq/L   Potassium 3.5  3.5 - 5.1 mEq/L   Chloride 101  96 - 112 mEq/L   CO2 33 (*) 19 - 32 mEq/L   Glucose, Bld 98  70 - 99 mg/dL   BUN 16  6 - 23 mg/dL   Creatinine, Ser 0.7  0.4 - 1.2 mg/dL   Calcium 9.0  8.4 - 78.2 mg/dL   GFR 95.62  >13.08 mL/min  HEPATIC FUNCTION PANEL      Component Value Range   Total Bilirubin 0.9  0.3 - 1.2 mg/dL   Bilirubin, Direct 0.1  0.0 - 0.3 mg/dL   Alkaline Phosphatase 52  39 - 117 U/L   AST 22  0 - 37 U/L   ALT 13  0 - 35 U/L   Total Protein 7.0  6.0 - 8.3 g/dL   Albumin 3.7  3.5 - 5.2 g/dL  POCT URINALYSIS DIPSTICK      Component Value Range   Color, UA yellow     Clarity, UA clear     Glucose, UA n     Bilirubin, UA n     Ketones, UA n     Spec Grav, UA 1.015     Blood, UA n     pH, UA 7.5     Protein, UA n     Urobilinogen, UA 0.2     Nitrite, UA n     Leukocytes, UA small (1+)    TSH      Component Value Range   TSH 0.93  0.35 - 5.50 uIU/mL  CBC WITH DIFFERENTIAL      Component Value Range   WBC 5.1  4.5 - 10.5 K/uL   RBC 4.20  3.87 - 5.11 Mil/uL   Hemoglobin 13.0  12.0 - 15.0 g/dL  HCT 39.8  36.0 - 46.0 %   MCV 94.9  78.0 - 100.0 fl   MCHC 32.7  30.0 - 36.0 g/dL   RDW 40.9  81.1 - 91.4 %   Platelets 192.0  150.0 - 400.0 K/uL   Neutrophils Relative 63.6  43.0 - 77.0 %   Lymphocytes Relative 28.5  12.0 - 46.0 %   Monocytes Relative 6.4  3.0 - 12.0 %   Eosinophils Relative 1.1  0.0 - 5.0 %   Basophils Relative 0.4  0.0 - 3.0 %   Neutro Abs 3.2  1.4 - 7.7 K/uL   Lymphs Abs 1.5  0.7 - 4.0 K/uL   Monocytes Absolute 0.3  0.1 - 1.0 K/uL   Eosinophils Absolute 0.1  0.0 - 0.7 K/uL   Basophils Absolute 0.0  0.0 - 0.1  K/uL  LDL CHOLESTEROL, DIRECT      Component Value Range   Direct LDL 135.3      EKG Orders placed in visit on 11/17/12  . EKG 12-LEAD     Prior Assessment and Plan Problem List as of 11/17/2012          HYPERLIPIDEMIA   HYPERTENSION   ATRIAL FIBRILLATION   CELLULITIS, LEGS   OSTEOPOROSIS   LEG EDEMA   LYMPHADENOPATHY   Long term current use of anticoagulant       Imaging: Dg Chest 2 View  10/31/2012  *RADIOLOGY REPORT*  Clinical Data: Physical exam.  Atrial fibrillation.  CHEST - 2 VIEW  Comparison: 10/08/2008  Findings: The cardiac silhouette is mildly enlarged but normal in configuration.  No mediastinal or hilar mass or adenopathy is noted.  The lungs are clear.  There is a moderate to marked compression fracture of what appears to be T12, which has increased from prior exam.  The bony thorax is demineralized but otherwise intact.  IMPRESSION: No acute cardiopulmonary disease.  Moderate to marked compression fracture T12 increased from the prior chest radiograph.     The increased fracture could be  recent or be chronic but new from the prior exam.   Original Report Authenticated By: Amie Portland, M.D.      [facility] Calculation: Score not calculated. Missing: Total Cholesterol

## 2012-11-17 NOTE — Assessment & Plan Note (Signed)
Excellent blood pressure control. She will remain on lasix and metoprolol.   Recent labs reveal that Creatinine is 0.7 with BUN of 16. Potassium is 3.5 Na is 140.

## 2012-11-17 NOTE — Assessment & Plan Note (Signed)
Rate is well controlled at present. Recheck of HR during auscultation was 78 bpm. She is followed by our clinic for coumadin dosing. Will continue her on her current medications. Hemoccult stool cards are provided. She will be seen in 9 months unless she has complaints. She is to express these to our coumadin clinic so that we may schedule her sooner.

## 2012-11-17 NOTE — Patient Instructions (Addendum)
Your physician recommends that you schedule a follow-up appointment in: 9 MONTHS WITH KL/RR  YOUR PHYSICIAN RECOMMENDS YOU COMPLETE THE STOOL CARDS GIVEN TODAY AT YOUR OFFICE VISIT, FOLLOW DIRECTIONS ENCLOSED AND PLEASE RETURN TO OUR OFFICE ONCE COMPLETED

## 2012-11-17 NOTE — Progress Notes (Signed)
   HPI: Angie Sullivan is a pleasant 78 y/o patient of Dr. Dietrich Pates we are following for ongoing assessment and treatment of chronic atrial fibrillation, and hypertension with history of hypercholesterolemia, and mild dementia. She comes today after one year without complaints. She is followed by Dr. Clent Ridges for PCP needs and has had recent labs completed 3 weeks ago. She is followed by our [place] office coumadin clinic. She denies dizziness, bleeding issues, excessive bruising or palpitations. She has not been hospitalized, had ER visits, or had any new medical diagnosis.  No Known Allergies  Current Outpatient Prescriptions  Medication Sig Dispense Refill  . donepezil (ARICEPT) 10 MG tablet Take 1 tablet (10 mg total) by mouth at bedtime as needed.  30 tablet  11  . furosemide (LASIX) 40 MG tablet Take 1 tablet (40 mg total) by mouth daily.  90 tablet  3  . metoprolol (LOPRESSOR) 50 MG tablet Take 1 tablet (50 mg total) by mouth 2 (two) times daily.  180 tablet  3  . potassium chloride (KLOR-CON M10) 10 MEQ tablet Take 1 tablet (10 mEq total) by mouth daily.  90 tablet  3  . raloxifene (EVISTA) 60 MG tablet Take 1 tablet (60 mg total) by mouth daily.  90 tablet  3  . warfarin (COUMADIN) 5 MG tablet Take 5 mg by mouth as directed. Take 1 tablet every day and then on Mon, Wed, Fri take 1/2 tablet      . warfarin (COUMADIN) 5 MG tablet TAKE ONE TABLET BY MOUTH ON MONDAY, WEDNESDAY, AND FRIDAY, THEN TAKE ONE-HALF TABLET ON TUESDAY, THURSDAY, SATURDAY, AND SUNDAY  30 tablet  0  . [DISCONTINUED] potassium chloride (KLOR-CON 10) 10 MEQ CR tablet Take 1 tablet (10 mEq total) by mouth daily.  30 tablet  11    Past Medical History  Diagnosis Date  . Hypertension   . Hyperlipidemia   . Osteoporosis     last dexa 09/19/09  . Chronic atrial fibrillation   . Diverticulosis     clubbing polyps, internal hemorrhoids  . Pedal edema   . Dementia     Past Surgical History  Procedure Date  . Colonoscopy  09 29 06  . Cataract extraction   . Breast lumpectomy   . Tubal ligation     WUJ:WJXBJY of systems complete and found to be negative unless listed above  PHYSICAL EXAM BP 125/79  Pulse 65  Ht 5\' 8"  (1.727 m)  Wt 139 lb 4 oz (63.163 kg)  BMI 21.17 kg/m2  SpO2 94%  General: Well developed, well nourished, in no acute distress Head: Eyes PERRLA, No xanthomas.   Normal cephalic and atramatic  Lungs: Clear bilaterally to auscultation and percussion. Heart: HRIR S1 S2, without MRG.  Pulses are 2+ & equal.            No carotid bruit. No JVD.  No abdominal bruits. No femoral bruits. Abdomen: Bowel sounds are positive, abdomen soft and non-tender without masses or                  Hernia's noted. Msk:  Back significant kyphosis, normal gait. Normal strength and tone for age. Extremities: No clubbing, cyanosis or edema.  DP +1 Neuro: Alert and oriented X 3. Psych:  Good affect, responds appropriately  EKG: Atrial fibrillation, rate of 90  bpm.   ASSESSMENT AND PLAN

## 2012-11-17 NOTE — Assessment & Plan Note (Signed)
She has had recent cholesterol panel completed in the last 3 weeks. She is not on a statin. TC 210, TG 113, HDL 56.7  LFT's WNL She is advised on a low cholesterol diet. Will not place on statin at this time.

## 2012-11-27 ENCOUNTER — Telehealth: Payer: Self-pay | Admitting: Cardiology

## 2012-11-27 NOTE — Telephone Encounter (Signed)
Patient no showed appointment.  Left message for patient to call office to reschedule.  Letter also mailed. / tgs

## 2012-12-10 ENCOUNTER — Ambulatory Visit (INDEPENDENT_AMBULATORY_CARE_PROVIDER_SITE_OTHER): Payer: Medicare Other | Admitting: *Deleted

## 2012-12-10 DIAGNOSIS — Z7901 Long term (current) use of anticoagulants: Secondary | ICD-10-CM

## 2012-12-10 DIAGNOSIS — I4891 Unspecified atrial fibrillation: Secondary | ICD-10-CM

## 2012-12-10 LAB — POCT INR: INR: 1.3

## 2012-12-10 MED ORDER — WARFARIN SODIUM 5 MG PO TABS: ORAL_TABLET | ORAL | Status: DC

## 2012-12-11 ENCOUNTER — Telehealth: Payer: Self-pay

## 2012-12-11 DIAGNOSIS — R350 Frequency of micturition: Secondary | ICD-10-CM

## 2012-12-11 NOTE — Telephone Encounter (Signed)
The [facility] nurse is out to visit pt and pt has symptoms of a UTI.  Pt has urinary frequency per the nurse and would like to drop of a urine specimen.  Asked if pt could come in for an ov on 12/12/2012 and pt's friend states she has a doctor's appt at 1 pm and needs either an early morning appt or late afternoon appt.  Pt request that a message be sent to Dr. Clent Ridges to see if a sample can be dropped off instead.  Pls advise.   Laretta Bolster [phone] can be contacted to tell if pt can drop of a sample.

## 2012-12-12 ENCOUNTER — Other Ambulatory Visit (INDEPENDENT_AMBULATORY_CARE_PROVIDER_SITE_OTHER): Payer: Medicare Other

## 2012-12-12 ENCOUNTER — Telehealth: Payer: Self-pay | Admitting: Family Medicine

## 2012-12-12 DIAGNOSIS — N390 Urinary tract infection, site not specified: Secondary | ICD-10-CM

## 2012-12-12 DIAGNOSIS — R350 Frequency of micturition: Secondary | ICD-10-CM

## 2012-12-12 LAB — POCT URINALYSIS DIPSTICK
Ketones, UA: NEGATIVE
Protein, UA: NEGATIVE
Spec Grav, UA: 1.025
pH, UA: 5

## 2012-12-12 MED ORDER — CIPROFLOXACIN HCL 500 MG PO TABS: 500.0000 mg | ORAL_TABLET | Freq: Two times a day (BID) | ORAL | Status: DC

## 2012-12-12 NOTE — Telephone Encounter (Signed)
I put order in computer for UA, can you call pt to schedule the lab appointment?

## 2012-12-12 NOTE — Telephone Encounter (Signed)
appt set

## 2012-12-12 NOTE — Telephone Encounter (Signed)
I spoke with pt and sent script e-scribe. 

## 2012-12-12 NOTE — Telephone Encounter (Signed)
It would be fine to drop off a sample

## 2012-12-12 NOTE — Telephone Encounter (Signed)
She has UTI symptoms and a positive UA. Call in Cipro 500 mg bid for 7 days.

## 2012-12-15 LAB — URINE CULTURE

## 2012-12-19 NOTE — Progress Notes (Signed)
Quick Note:  I spoke with pt ______ 

## 2012-12-25 ENCOUNTER — Ambulatory Visit (INDEPENDENT_AMBULATORY_CARE_PROVIDER_SITE_OTHER): Payer: Medicare Other | Admitting: *Deleted

## 2012-12-25 DIAGNOSIS — I4891 Unspecified atrial fibrillation: Secondary | ICD-10-CM

## 2012-12-25 DIAGNOSIS — Z7901 Long term (current) use of anticoagulants: Secondary | ICD-10-CM

## 2012-12-25 LAB — POCT INR: INR: 2

## 2013-01-21 ENCOUNTER — Ambulatory Visit (INDEPENDENT_AMBULATORY_CARE_PROVIDER_SITE_OTHER): Payer: Medicare Other | Admitting: *Deleted

## 2013-01-21 DIAGNOSIS — I4891 Unspecified atrial fibrillation: Secondary | ICD-10-CM

## 2013-01-21 DIAGNOSIS — Z7901 Long term (current) use of anticoagulants: Secondary | ICD-10-CM

## 2013-01-21 LAB — POCT INR: INR: 1.9

## 2013-01-28 DIAGNOSIS — Z0279 Encounter for issue of other medical certificate: Secondary | ICD-10-CM

## 2013-03-02 ENCOUNTER — Ambulatory Visit (INDEPENDENT_AMBULATORY_CARE_PROVIDER_SITE_OTHER): Payer: Medicare Other | Admitting: *Deleted

## 2013-03-02 DIAGNOSIS — Z7901 Long term (current) use of anticoagulants: Secondary | ICD-10-CM

## 2013-03-02 DIAGNOSIS — I4891 Unspecified atrial fibrillation: Secondary | ICD-10-CM

## 2013-03-02 LAB — POCT INR: INR: 1.6

## 2013-03-23 ENCOUNTER — Ambulatory Visit (INDEPENDENT_AMBULATORY_CARE_PROVIDER_SITE_OTHER): Payer: Medicare Other | Admitting: *Deleted

## 2013-03-23 DIAGNOSIS — I4891 Unspecified atrial fibrillation: Secondary | ICD-10-CM

## 2013-03-23 DIAGNOSIS — Z7901 Long term (current) use of anticoagulants: Secondary | ICD-10-CM

## 2013-04-13 ENCOUNTER — Ambulatory Visit (INDEPENDENT_AMBULATORY_CARE_PROVIDER_SITE_OTHER): Payer: Medicare Other | Admitting: *Deleted

## 2013-04-13 DIAGNOSIS — Z7901 Long term (current) use of anticoagulants: Secondary | ICD-10-CM

## 2013-04-13 DIAGNOSIS — I4891 Unspecified atrial fibrillation: Secondary | ICD-10-CM

## 2013-04-13 LAB — POCT INR: INR: 2.4

## 2013-04-15 ENCOUNTER — Telehealth: Payer: Self-pay | Admitting: *Deleted

## 2013-04-15 NOTE — Telephone Encounter (Signed)
.  left message to have patient return my call.  

## 2013-04-15 NOTE — Telephone Encounter (Signed)
Message copied by Ovidio Kin on Wed Apr 15, 2013  6:38 PM ------      Message from: San Jetty      Created: Mon Apr 13, 2013 11:46 AM       PT IS SCHEDULED FOR LEXISCAN AT [facility] 04/16/13.            KIM, PLEASE CALL PATIENT WITH INSTRUCTIONS. ------

## 2013-04-16 NOTE — Telephone Encounter (Signed)
Noted error, pt no longer scheduled for lexiscan

## 2013-04-23 ENCOUNTER — Other Ambulatory Visit: Payer: Self-pay | Admitting: Cardiology

## 2013-04-23 ENCOUNTER — Telehealth: Payer: Self-pay | Admitting: *Deleted

## 2013-04-23 MED ORDER — WARFARIN SODIUM 5 MG PO TABS: 5.0000 mg | ORAL_TABLET | Freq: Every day | ORAL | Status: DC

## 2013-04-23 NOTE — Telephone Encounter (Signed)
rx sent to pharmacy by e-script  

## 2013-04-23 NOTE — Telephone Encounter (Signed)
PT NEEDS WARFRIN CALLED IN T [facility] IN [place], SHE HAS NO REFILLS LEFT.

## 2013-05-11 ENCOUNTER — Ambulatory Visit (INDEPENDENT_AMBULATORY_CARE_PROVIDER_SITE_OTHER): Payer: Medicare Other | Admitting: *Deleted

## 2013-05-11 DIAGNOSIS — Z7901 Long term (current) use of anticoagulants: Secondary | ICD-10-CM

## 2013-05-11 DIAGNOSIS — I4891 Unspecified atrial fibrillation: Secondary | ICD-10-CM

## 2013-05-22 ENCOUNTER — Other Ambulatory Visit: Payer: Self-pay | Admitting: *Deleted

## 2013-05-22 MED ORDER — WARFARIN SODIUM 5 MG PO TABS: 5.0000 mg | ORAL_TABLET | Freq: Every day | ORAL | Status: DC

## 2013-05-22 NOTE — Telephone Encounter (Signed)
Medication sent via escribe for warfarin 

## 2013-06-17 ENCOUNTER — Ambulatory Visit (INDEPENDENT_AMBULATORY_CARE_PROVIDER_SITE_OTHER): Payer: Medicare Other | Admitting: Family Medicine

## 2013-06-17 ENCOUNTER — Encounter: Payer: Self-pay | Admitting: Family Medicine

## 2013-06-17 VITALS — BP 100/60 | HR 66 | Temp 98.2°F | Wt 145.0 lb

## 2013-06-17 DIAGNOSIS — R159 Full incontinence of feces: Secondary | ICD-10-CM

## 2013-06-17 DIAGNOSIS — R195 Other fecal abnormalities: Secondary | ICD-10-CM

## 2013-06-17 NOTE — Progress Notes (Signed)
  Subjective:    Patient ID: Angie Sullivan, female    DOB: 1934/04/21, 79 y.o.   MRN: [id number]  HPI Here for one month of loose stools with occasional stool incontinence. She denies any cramps or pain, no nausea, no fever. Appetite is unchanged. No recent change in meds.    Review of Systems  Constitutional: Negative.   Gastrointestinal: Positive for diarrhea. Negative for nausea, vomiting, abdominal pain, constipation, blood in stool, abdominal distention and rectal pain.       Objective:   Physical Exam  Constitutional: She appears well-developed and well-nourished.  Abdominal: Soft. Bowel sounds are normal. She exhibits no distension and no mass. There is no tenderness. There is no rebound and no guarding.          Assessment & Plan:  She does not appear to be ill. I think her advancing dementia is part of the incontinence problem. Suggested she try taking 1/2 of tsp of Imodium bid. Recheck prn

## 2013-06-22 ENCOUNTER — Ambulatory Visit (INDEPENDENT_AMBULATORY_CARE_PROVIDER_SITE_OTHER): Payer: Medicare Other | Admitting: *Deleted

## 2013-06-22 DIAGNOSIS — Z7901 Long term (current) use of anticoagulants: Secondary | ICD-10-CM

## 2013-06-22 DIAGNOSIS — I4891 Unspecified atrial fibrillation: Secondary | ICD-10-CM

## 2013-06-22 LAB — POCT INR: INR: 3.7

## 2013-06-24 ENCOUNTER — Other Ambulatory Visit: Payer: Self-pay | Admitting: Family Medicine

## 2013-07-23 ENCOUNTER — Ambulatory Visit (INDEPENDENT_AMBULATORY_CARE_PROVIDER_SITE_OTHER): Payer: Medicare Other | Admitting: *Deleted

## 2013-07-23 DIAGNOSIS — Z7901 Long term (current) use of anticoagulants: Secondary | ICD-10-CM

## 2013-07-23 DIAGNOSIS — I4891 Unspecified atrial fibrillation: Secondary | ICD-10-CM

## 2013-07-23 LAB — POCT INR: INR: 3.5

## 2013-08-13 ENCOUNTER — Ambulatory Visit (INDEPENDENT_AMBULATORY_CARE_PROVIDER_SITE_OTHER): Payer: Medicare Other | Admitting: *Deleted

## 2013-08-13 DIAGNOSIS — I4891 Unspecified atrial fibrillation: Secondary | ICD-10-CM

## 2013-08-13 DIAGNOSIS — Z7901 Long term (current) use of anticoagulants: Secondary | ICD-10-CM

## 2013-08-13 LAB — POCT INR: INR: 3.4

## 2013-09-09 ENCOUNTER — Ambulatory Visit (INDEPENDENT_AMBULATORY_CARE_PROVIDER_SITE_OTHER): Payer: Medicare Other | Admitting: *Deleted

## 2013-09-09 DIAGNOSIS — Z7901 Long term (current) use of anticoagulants: Secondary | ICD-10-CM

## 2013-09-09 DIAGNOSIS — I4891 Unspecified atrial fibrillation: Secondary | ICD-10-CM

## 2013-09-15 ENCOUNTER — Telehealth: Payer: Self-pay | Admitting: *Deleted

## 2013-09-15 NOTE — Telephone Encounter (Signed)
rx fax form [facility] for warfrin 5 mg #30

## 2013-09-15 NOTE — Telephone Encounter (Signed)
Noted pt overdue for follow up, left message to call our office to schedule

## 2013-09-17 ENCOUNTER — Ambulatory Visit (INDEPENDENT_AMBULATORY_CARE_PROVIDER_SITE_OTHER): Payer: Medicare Other | Admitting: Adult Health

## 2013-09-17 ENCOUNTER — Encounter: Payer: Self-pay | Admitting: Adult Health

## 2013-09-17 VITALS — BP 111/69 | HR 74 | Ht 68.0 in | Wt 150.0 lb

## 2013-09-17 DIAGNOSIS — R609 Edema, unspecified: Secondary | ICD-10-CM

## 2013-09-17 DIAGNOSIS — M25572 Pain in left ankle and joints of left foot: Secondary | ICD-10-CM

## 2013-09-17 DIAGNOSIS — I10 Essential (primary) hypertension: Secondary | ICD-10-CM

## 2013-09-17 DIAGNOSIS — Z7901 Long term (current) use of anticoagulants: Secondary | ICD-10-CM

## 2013-09-17 DIAGNOSIS — M25579 Pain in unspecified ankle and joints of unspecified foot: Secondary | ICD-10-CM

## 2013-09-17 MED ORDER — METOPROLOL TARTRATE 50 MG PO TABS: 50.0000 mg | ORAL_TABLET | Freq: Two times a day (BID) | ORAL | Status: DC

## 2013-09-17 MED ORDER — WARFARIN SODIUM 5 MG PO TABS: 5.0000 mg | ORAL_TABLET | Freq: Every day | ORAL | Status: DC

## 2013-09-17 MED ORDER — POTASSIUM CHLORIDE CRYS ER 10 MEQ PO TBCR: 10.0000 meq | EXTENDED_RELEASE_TABLET | Freq: Every day | ORAL | Status: DC

## 2013-09-17 MED ORDER — FUROSEMIDE 40 MG PO TABS: 40.0000 mg | ORAL_TABLET | Freq: Every day | ORAL | Status: DC

## 2013-09-17 NOTE — Patient Instructions (Addendum)
Your physician recommends that you schedule a follow-up appointment in: 1 year You will receive a reminder letter two months in advance reminding you to call and schedule your appointment. If you don't receive this letter, please contact our office.  Xray on L ankle

## 2013-09-17 NOTE — Progress Notes (Signed)
    HPI: Angie Sullivan comes today as a walk-on requesting refills on her coumadin. She is a former  patient of Dr. Dietrich Pates we are following for ongoing assessment and treatment of atrial fib. She has no cardiac complaints but her caregiver states that she has had some swelling and limping in her left ankle. The patient has dementia and therefore is a poor historian concerning falls or twisting her ankle.   No Known Allergies  Current Outpatient Prescriptions  Medication Sig Dispense Refill  . donepezil (ARICEPT) 10 MG tablet TAKE ONE TABLET BY MOUTH AT BEDTIME AS NEEDED  30 tablet  6  . furosemide (LASIX) 40 MG tablet Take 1 tablet (40 mg total) by mouth daily.  90 tablet  3  . metoprolol (LOPRESSOR) 50 MG tablet Take 1 tablet (50 mg total) by mouth 2 (two) times daily.  180 tablet  3  . potassium chloride (KLOR-CON M10) 10 MEQ tablet Take 1 tablet (10 mEq total) by mouth daily.  90 tablet  3  . raloxifene (EVISTA) 60 MG tablet Take 1 tablet (60 mg total) by mouth daily.  90 tablet  3  . warfarin (COUMADIN) 5 MG tablet Take 1 tablet (5 mg total) by mouth daily.  30 tablet  2  . [DISCONTINUED] potassium chloride (KLOR-CON 10) 10 MEQ CR tablet Take 1 tablet (10 mEq total) by mouth daily.  30 tablet  11   No current facility-administered medications for this visit.    Past Medical History  Diagnosis Date  . Hypertension   . Hyperlipidemia   . Osteoporosis     last dexa 09/19/09  . Chronic atrial fibrillation   . Diverticulosis     clubbing polyps, internal hemorrhoids  . Pedal edema   . Dementia     Past Surgical History  Procedure Laterality Date  . Colonoscopy  09 29 06  . Cataract extraction    . Breast lumpectomy    . Tubal ligation      ROS: Review of systems complete and found to be negative unless listed above  PHYSICAL EXAM BP 111/69  Pulse 74  Ht 5\' 8"  (1.727 m)  Wt 150 lb (68.04 kg)  BMI 22.81 kg/m2  General: Well developed, well nourished, in no acute  distress Head: Eyes PERRLA, No xanthomas.   Normal cephalic and atramatic  Lungs: Clear bilaterally to auscultation and percussion. Heart: HRIR S1 S2, without MRG.  Pulses are 2+ & equal.            No carotid bruit. No JVD.  No abdominal bruits. No femoral bruits. Abdomen: Bowel sounds are positive, abdomen soft and non-tender without masses or                  Hernia's noted. Msk:  Back normal, slow but normal gait. Normal strength and tone for age. Extremities: No clubbing, cyanosis or edema. Mild edema of her left ankle, with varicosities noted. No pain with ROM or weight bearing.  DP +1 Neuro: Alert and oriented X 3. Psych:  Flat  affect, responds appropriately  EKG: Atrial fib with rate of 75 bpm.  ASSESSMENT AND PLAN

## 2013-09-17 NOTE — Progress Notes (Deleted)
Name: Angie Sullivan    DOB: 15-Mar-1934  Age: 79 y.o.  MR#: [id number]       PCP:  Nelwyn Salisbury, MD      Insurance: Payor: [employer]vertising copy[employer] MEDICARE / Plan: AARP MEDICARE COMPLETE / Product Type: *No Product type* /   CC:   No chief complaint on file.   VS Filed Vitals:   09/17/13 1528  BP: 111/69  Pulse: 74  Height: 5\' 8"  (1.727 m)  Weight: 150 lb (68.04 kg)    Weights Current Weight  09/17/13 150 lb (68.04 kg)  06/17/13 145 lb (65.772 kg)  11/17/12 139 lb 4 oz (63.163 kg)    Blood Pressure  BP Readings from Last 3 Encounters:  09/17/13 111/69  06/17/13 100/60  11/17/12 125/79     Admit date:  (Not on file) Last encounter with RMR:  Visit date not found   Allergy Review of patient's allergies indicates no known allergies.  Current Outpatient Prescriptions  Medication Sig Dispense Refill  . donepezil (ARICEPT) 10 MG tablet TAKE ONE TABLET BY MOUTH AT BEDTIME AS NEEDED  30 tablet  6  . furosemide (LASIX) 40 MG tablet Take 1 tablet (40 mg total) by mouth daily.  90 tablet  3  . metoprolol (LOPRESSOR) 50 MG tablet Take 1 tablet (50 mg total) by mouth 2 (two) times daily.  180 tablet  3  . potassium chloride (KLOR-CON M10) 10 MEQ tablet Take 1 tablet (10 mEq total) by mouth daily.  90 tablet  3  . raloxifene (EVISTA) 60 MG tablet Take 1 tablet (60 mg total) by mouth daily.  90 tablet  3  . warfarin (COUMADIN) 5 MG tablet Take 1 tablet (5 mg total) by mouth daily.  30 tablet  2  . [DISCONTINUED] potassium chloride (KLOR-CON 10) 10 MEQ CR tablet Take 1 tablet (10 mEq total) by mouth daily.  30 tablet  11   No current facility-administered medications for this visit.    Discontinued Meds:   There are no discontinued medications.  Patient Active Problem List   Diagnosis Date Noted  . Long term current use of anticoagulant 03/07/2011  . HYPERLIPIDEMIA 02/09/2010  . LYMPHADENOPATHY 09/03/2008  . CELLULITIS, LEGS 08/17/2008  . LEG EDEMA 08/17/2008  . HYPERTENSION  07/14/2007  . ATRIAL FIBRILLATION 07/14/2007  . OSTEOPOROSIS 07/14/2007    LABS    Component Value Date/Time   NA 140 10/31/2012 1013   NA 142 09/17/2011 1116   NA 143 09/15/2010 0958   K 3.5 10/31/2012 1013   K 3.1* 09/17/2011 1116   K 3.9 09/15/2010 0958   CL 101 10/31/2012 1013   CL 100 09/17/2011 1116   CL 101 09/15/2010 0958   CO2 33* 10/31/2012 1013   CO2 31 09/17/2011 1116   CO2 31 09/15/2010 0958   GLUCOSE 98 10/31/2012 1013   GLUCOSE 91 09/17/2011 1116   GLUCOSE 89 09/15/2010 0958   BUN 16 10/31/2012 1013   BUN 15 09/17/2011 1116   BUN 13 09/15/2010 0958   CREATININE 0.7 10/31/2012 1013   CREATININE 0.7 09/17/2011 1116   CREATININE 0.6 09/15/2010 0958   CALCIUM 9.0 10/31/2012 1013   CALCIUM 8.9 09/17/2011 1116   CALCIUM 9.4 09/15/2010 0958   GFRNONAA 101.74 09/15/2010 0958   GFRNONAA 87.04 09/05/2009 0000   GFRNONAA 75 08/17/2008 0000   GFRAA 91 08/17/2008 0000   GFRAA 106 07/17/2007 0000   CMP     Component Value Date/Time   NA 140 10/31/2012 1013  K 3.5 10/31/2012 1013   CL 101 10/31/2012 1013   CO2 33* 10/31/2012 1013   GLUCOSE 98 10/31/2012 1013   BUN 16 10/31/2012 1013   CREATININE 0.7 10/31/2012 1013   CALCIUM 9.0 10/31/2012 1013   PROT 7.0 10/31/2012 1013   ALBUMIN 3.7 10/31/2012 1013   AST 22 10/31/2012 1013   ALT 13 10/31/2012 1013   ALKPHOS 52 10/31/2012 1013   BILITOT 0.9 10/31/2012 1013   GFRNONAA 101.74 09/15/2010 0958   GFRAA 91 08/17/2008 0000       Component Value Date/Time   WBC 5.1 10/31/2012 1013   WBC 4.3* 09/17/2011 1116   WBC 5.2 09/15/2010 0958   HGB 13.0 10/31/2012 1013   HGB 13.6 09/17/2011 1116   HGB 14.1 09/15/2010 0958   HCT 39.8 10/31/2012 1013   HCT 39.9 09/17/2011 1116   HCT 41.8 09/15/2010 0958   MCV 94.9 10/31/2012 1013   MCV 94.6 09/17/2011 1116   MCV 95.7 09/15/2010 0958    Lipid Panel     Component Value Date/Time   CHOL 210* 10/31/2012 1013   TRIG 113.0 10/31/2012 1013   HDL 56.70 10/31/2012 1013   CHOLHDL 4 10/31/2012 1013    VLDL 22.6 10/31/2012 1013   LDLCALC 137 11/24/2008    ABG No results found for this basename: phart, pco2, pco2art, po2, po2art, hco3, tco2, acidbasedef, o2sat     Lab Results  Component Value Date   TSH 0.93 10/31/2012   BNP (last 3 results) No results found for this basename: PROBNP,  in the last 8760 hours Cardiac Panel (last 3 results) No results found for this basename: CKTOTAL, CKMB, TROPONINI, RELINDX,  in the last 72 hours  Iron/TIBC/Ferritin No results found for this basename: iron, tibc, ferritin     EKG Orders placed in visit on 11/17/12  . EKG 12-LEAD     Prior Assessment and Plan Problem List as of 09/17/2013     Cardiovascular and Mediastinum   HYPERTENSION   Last Assessment & Plan   11/17/2012 Office Visit Written 11/17/2012  2:21 PM by Jodelle Gross, NP     Excellent blood pressure control. She will remain on lasix and metoprolol.   Recent labs reveal that Creatinine is 0.7 with BUN of 16. Potassium is 3.5 Na is 140.     ATRIAL FIBRILLATION   Last Assessment & Plan   11/17/2012 Office Visit Written 11/17/2012  2:19 PM by Jodelle Gross, NP     Rate is well controlled at present. Recheck of HR during auscultation was 78 bpm. She is followed by our clinic for coumadin dosing. Will continue her on her current medications. Hemoccult stool cards are provided. She will be seen in 9 months unless she has complaints. She is to express these to our coumadin clinic so that we may schedule her sooner.      Musculoskeletal and Integument   OSTEOPOROSIS     Immune and Lymphatic   LYMPHADENOPATHY     Other   HYPERLIPIDEMIA   Last Assessment & Plan   11/17/2012 Office Visit Written 11/17/2012  2:22 PM by Jodelle Gross, NP     She has had recent cholesterol panel completed in the last 3 weeks. She is not on a statin. TC 210, TG 113, HDL 56.7  LFT's WNL She is advised on a low cholesterol diet. Will not place on statin at this time.    CELLULITIS,  LEGS   LEG EDEMA   Long term current  use of anticoagulant       Imaging: No results found.

## 2013-09-17 NOTE — Assessment & Plan Note (Signed)
Blood pressure is low normal. No changes in medication regimen.

## 2013-09-17 NOTE — Assessment & Plan Note (Signed)
She is complaining of left ankle edema and caretaker states she is limping. She denies falls or twisting of the ankle. Will get X-ray of the left foot/ankle to evaluate for fx or injury.

## 2013-09-17 NOTE — Assessment & Plan Note (Signed)
Heart rate is well controlled. Coumadin is continued with refills. She is also provided refills on metoprolol. We will see her in one year unless symptomatic.

## 2013-09-17 NOTE — Assessment & Plan Note (Signed)
Coumadin refilled. 

## 2013-09-18 ENCOUNTER — Ambulatory Visit (HOSPITAL_COMMUNITY)
Admission: RE | Admit: 2013-09-18 | Discharge: 2013-09-18 | Disposition: A | Discharge status: Home / Self Care | Payer: Medicare Other | Source: Ambulatory Visit | Attending: Adult Health | Admitting: Adult Health

## 2013-09-18 DIAGNOSIS — M25572 Pain in left ankle and joints of left foot: Secondary | ICD-10-CM

## 2013-09-18 DIAGNOSIS — M25476 Effusion, unspecified foot: Secondary | ICD-10-CM | POA: Insufficient documentation

## 2013-09-18 DIAGNOSIS — M25473 Effusion, unspecified ankle: Secondary | ICD-10-CM | POA: Insufficient documentation

## 2013-09-18 NOTE — Addendum Note (Signed)
Addended by: Thompson Grayer on: 09/18/2013 10:44 AM   Modules accepted: Orders

## 2013-10-07 ENCOUNTER — Ambulatory Visit (INDEPENDENT_AMBULATORY_CARE_PROVIDER_SITE_OTHER): Payer: Medicare Other | Admitting: *Deleted

## 2013-10-07 DIAGNOSIS — Z7901 Long term (current) use of anticoagulants: Secondary | ICD-10-CM

## 2013-10-07 DIAGNOSIS — I4891 Unspecified atrial fibrillation: Secondary | ICD-10-CM

## 2013-10-07 LAB — POCT INR: INR: 2.5

## 2013-10-14 ENCOUNTER — Ambulatory Visit: Payer: Medicare Other | Admitting: Family Medicine

## 2013-10-15 ENCOUNTER — Ambulatory Visit (INDEPENDENT_AMBULATORY_CARE_PROVIDER_SITE_OTHER): Payer: Medicare Other | Admitting: Family Medicine

## 2013-10-15 ENCOUNTER — Encounter: Payer: Self-pay | Admitting: Family Medicine

## 2013-10-15 VITALS — BP 100/60 | HR 74 | Temp 98.3°F | Wt 152.0 lb

## 2013-10-15 DIAGNOSIS — L738 Other specified follicular disorders: Secondary | ICD-10-CM

## 2013-10-15 DIAGNOSIS — J209 Acute bronchitis, unspecified: Secondary | ICD-10-CM

## 2013-10-15 DIAGNOSIS — L739 Follicular disorder, unspecified: Secondary | ICD-10-CM

## 2013-10-15 MED ORDER — MUPIROCIN 2 % EX OINT: 1.0000 "application " | TOPICAL_OINTMENT | Freq: Three times a day (TID) | CUTANEOUS | Status: DC

## 2013-10-15 NOTE — Progress Notes (Signed)
  Subjective:    Patient ID: Angie Sullivan, female    DOB: Apr 14, 1934, 79 y.o.   MRN: [id number]  HPI Here with her health aide to recheck a bronchitis and a facial rash. She has been coughing for over a week and the cough is deep but non-productive. No fever. No SOB or chest pains. Then 3 days ago she developed an itchy rash over the face. No rash anywhere else on the body. She went to an Urgent Care clinic in [place] 2 days ago and had a CXR. She was diagnosed with bronchitis and was given a 10 day course of Cefdinir, which she has been taking for 2 days. She was also diagnosed with "scabies" on the face and was given Permethrin cream, but she has not started this because they doubt the diagnosis. She stays with her aide and has not slept in any strange beds or sat in any strange chairs or been around any animals lately.    Review of Systems  Constitutional: Negative.   HENT: Positive for congestion. Negative for postnasal drip and sinus pressure.   Eyes: Negative.   Respiratory: Positive for cough. Negative for shortness of breath and wheezing.   Cardiovascular: Negative.   Skin: Positive for rash.       Objective:   Physical Exam  Constitutional: She appears well-developed and well-nourished. No distress.  HENT:  Right Ear: External ear normal.  Left Ear: External ear normal.  Nose: Nose normal.  Mouth/Throat: Oropharynx is clear and moist.  Eyes: Conjunctivae are normal.  Pulmonary/Chest: Effort normal and breath sounds normal. No respiratory distress. She has no wheezes. She has no rales.  Lymphadenopathy:    She has no cervical adenopathy.  Skin:  Widespread erythematous rash over the face with macules and papules. This does not look like scabies           Assessment & Plan:  She has bronchitis and I encouraged her to finish out the Cefdinir. She does not have scabies. Instead it is more consistent with a folliculitis, so she will use Bactroban ointment tid. Recheck  prn

## 2013-11-03 ENCOUNTER — Other Ambulatory Visit (INDEPENDENT_AMBULATORY_CARE_PROVIDER_SITE_OTHER): Payer: Medicare Other

## 2013-11-03 DIAGNOSIS — Z0000 Encounter for general adult medical examination without abnormal findings: Secondary | ICD-10-CM

## 2013-11-03 DIAGNOSIS — E785 Hyperlipidemia, unspecified: Secondary | ICD-10-CM

## 2013-11-03 DIAGNOSIS — I10 Essential (primary) hypertension: Secondary | ICD-10-CM

## 2013-11-03 LAB — CBC WITH DIFFERENTIAL/PLATELET
Basophils Relative: 0.7 % (ref 0.0–3.0)
Eosinophils Relative: 6.1 % — ABNORMAL HIGH (ref 0.0–5.0)
HCT: 39.8 % (ref 36.0–46.0)
Hemoglobin: 13.3 g/dL (ref 12.0–15.0)
Lymphs Abs: 1.3 10*3/uL (ref 0.7–4.0)
MCV: 89.2 fl (ref 78.0–100.0)
Monocytes Absolute: 0.4 10*3/uL (ref 0.1–1.0)
Monocytes Relative: 8.1 % (ref 3.0–12.0)
Neutro Abs: 2.7 10*3/uL (ref 1.4–7.7)
RDW: 15.5 % — ABNORMAL HIGH (ref 11.5–14.6)
WBC: 4.8 10*3/uL (ref 4.5–10.5)

## 2013-11-03 LAB — POCT URINALYSIS DIPSTICK
Bilirubin, UA: NEGATIVE
Blood, UA: NEGATIVE
Glucose, UA: NEGATIVE
Leukocytes, UA: NEGATIVE
Nitrite, UA: NEGATIVE
Protein, UA: NEGATIVE
Spec Grav, UA: 1.01
Urobilinogen, UA: 0.2

## 2013-11-03 LAB — LIPID PANEL
Total CHOL/HDL Ratio: 5
VLDL: 28.8 mg/dL (ref 0.0–40.0)

## 2013-11-03 LAB — LDL CHOLESTEROL, DIRECT: Direct LDL: 136.4 mg/dL

## 2013-11-03 LAB — BASIC METABOLIC PANEL
CO2: 28 mEq/L (ref 19–32)
Calcium: 8.6 mg/dL (ref 8.4–10.5)
Creatinine, Ser: 0.7 mg/dL (ref 0.4–1.2)
GFR: 93.76 mL/min (ref >60.00)
Glucose, Bld: 87 mg/dL (ref 70–99)

## 2013-11-03 LAB — HEPATIC FUNCTION PANEL
ALT: 12 U/L (ref 0–35)
Bilirubin, Direct: 0.1 mg/dL (ref 0.0–0.3)
Total Bilirubin: 1.1 mg/dL (ref 0.3–1.2)
Total Protein: 7.4 g/dL (ref 6.0–8.3)

## 2013-11-04 ENCOUNTER — Ambulatory Visit (INDEPENDENT_AMBULATORY_CARE_PROVIDER_SITE_OTHER): Payer: Medicare Other | Admitting: *Deleted

## 2013-11-04 DIAGNOSIS — Z7901 Long term (current) use of anticoagulants: Secondary | ICD-10-CM

## 2013-11-04 DIAGNOSIS — I4891 Unspecified atrial fibrillation: Secondary | ICD-10-CM

## 2013-11-04 LAB — POCT INR: INR: 3.7

## 2013-11-09 ENCOUNTER — Encounter: Payer: Medicare Other | Admitting: Family Medicine

## 2013-11-09 ENCOUNTER — Encounter: Payer: Self-pay | Admitting: Family Medicine

## 2013-11-09 ENCOUNTER — Ambulatory Visit (INDEPENDENT_AMBULATORY_CARE_PROVIDER_SITE_OTHER): Payer: Medicare Other | Admitting: Family Medicine

## 2013-11-09 VITALS — BP 100/70 | HR 75 | Temp 98.2°F | Ht 64.0 in | Wt 159.0 lb

## 2013-11-09 DIAGNOSIS — Z0000 Encounter for general adult medical examination without abnormal findings: Secondary | ICD-10-CM

## 2013-11-09 DIAGNOSIS — Z23 Encounter for immunization: Secondary | ICD-10-CM

## 2013-11-09 MED ORDER — RALOXIFENE HCL 60 MG PO TABS: 60.0000 mg | ORAL_TABLET | Freq: Every day | ORAL | Status: DC

## 2013-11-09 MED ORDER — POTASSIUM CHLORIDE CRYS ER 20 MEQ PO TBCR: 20.0000 meq | EXTENDED_RELEASE_TABLET | Freq: Every day | ORAL | Status: DC

## 2013-11-09 MED ORDER — DONEPEZIL HCL 10 MG PO TABS: ORAL_TABLET | ORAL | Status: DC

## 2013-11-09 NOTE — Progress Notes (Signed)
   Subjective:    Patient ID: Angie Sullivan, female    DOB: 1934/08/30, 79 y.o.   MRN: [id number]  HPI 79 yr old female for a cpx. She is doing well.    Review of Systems  Constitutional: Negative.   HENT: Negative.   Eyes: Negative.   Respiratory: Negative.   Cardiovascular: Negative.   Gastrointestinal: Negative.   Genitourinary: Negative for dysuria, urgency, frequency, hematuria, flank pain, decreased urine volume, enuresis, difficulty urinating, pelvic pain and dyspareunia.  Musculoskeletal: Negative.   Skin: Negative.   Neurological: Negative.   Psychiatric/Behavioral: Negative.        Objective:   Physical Exam  Constitutional: She is oriented to person, place, and time. She appears well-developed and well-nourished. No distress.  HENT:  Head: Normocephalic and atraumatic.  Right Ear: External ear normal.  Left Ear: External ear normal.  Nose: Nose normal.  Mouth/Throat: Oropharynx is clear and moist. No oropharyngeal exudate.  Eyes: Conjunctivae and EOM are normal. Pupils are equal, round, and reactive to light. No scleral icterus.  Neck: Normal range of motion. Neck supple. No JVD present. No thyromegaly present.  Cardiovascular: Normal rate, regular rhythm, normal heart sounds and intact distal pulses.  Exam reveals no gallop and no friction rub.   No murmur heard. Pulmonary/Chest: Effort normal and breath sounds normal. No respiratory distress. She has no wheezes. She has no rales. She exhibits no tenderness.  Abdominal: Soft. Bowel sounds are normal. She exhibits no distension and no mass. There is no tenderness. There is no rebound and no guarding.  Musculoskeletal: Normal range of motion. She exhibits no edema and no tenderness.  Lymphadenopathy:    She has no cervical adenopathy.  Neurological: She is alert and oriented to person, place, and time. She has normal reflexes. No cranial nerve deficit. She exhibits normal muscle tone. Coordination normal.  Skin:  Skin is warm and dry. No rash noted. No erythema.  Psychiatric: She has a normal mood and affect. Her behavior is normal. Judgment and thought content normal.          Assessment & Plan:  Well exam. She will set up a mammogram.

## 2013-11-09 NOTE — Progress Notes (Signed)
Pre visit review using our clinic review tool, if applicable. No additional management support is needed unless otherwise documented below in the visit note. 

## 2013-11-13 ENCOUNTER — Encounter: Payer: Medicare Other | Admitting: Family Medicine

## 2013-11-30 ENCOUNTER — Other Ambulatory Visit: Payer: Self-pay

## 2013-11-30 DIAGNOSIS — Z1231 Encounter for screening mammogram for malignant neoplasm of breast: Secondary | ICD-10-CM

## 2013-12-02 ENCOUNTER — Ambulatory Visit (INDEPENDENT_AMBULATORY_CARE_PROVIDER_SITE_OTHER): Payer: Medicare Other | Admitting: *Deleted

## 2013-12-02 DIAGNOSIS — I4891 Unspecified atrial fibrillation: Secondary | ICD-10-CM

## 2013-12-02 DIAGNOSIS — Z7901 Long term (current) use of anticoagulants: Secondary | ICD-10-CM

## 2013-12-02 LAB — POCT INR: INR: 2.7

## 2013-12-12 ENCOUNTER — Other Ambulatory Visit: Payer: Self-pay | Admitting: Adult Health

## 2013-12-28 ENCOUNTER — Ambulatory Visit
Admission: RE | Admit: 2013-12-28 | Discharge: 2013-12-28 | Disposition: A | Discharge status: Home / Self Care | Payer: Medicare Other | Source: Ambulatory Visit

## 2013-12-28 DIAGNOSIS — Z1231 Encounter for screening mammogram for malignant neoplasm of breast: Secondary | ICD-10-CM

## 2013-12-30 ENCOUNTER — Other Ambulatory Visit: Payer: Self-pay | Admitting: *Deleted

## 2013-12-30 ENCOUNTER — Ambulatory Visit (INDEPENDENT_AMBULATORY_CARE_PROVIDER_SITE_OTHER): Payer: Medicare Other | Admitting: *Deleted

## 2013-12-30 DIAGNOSIS — I4891 Unspecified atrial fibrillation: Secondary | ICD-10-CM

## 2013-12-30 DIAGNOSIS — Z7901 Long term (current) use of anticoagulants: Secondary | ICD-10-CM

## 2013-12-30 DIAGNOSIS — Z5181 Encounter for therapeutic drug level monitoring: Secondary | ICD-10-CM | POA: Insufficient documentation

## 2013-12-30 LAB — POCT INR: INR: 2.2

## 2013-12-30 MED ORDER — WARFARIN SODIUM 5 MG PO TABS: ORAL_TABLET | ORAL | Status: DC

## 2014-01-27 ENCOUNTER — Ambulatory Visit (INDEPENDENT_AMBULATORY_CARE_PROVIDER_SITE_OTHER): Payer: Medicare Other | Admitting: *Deleted

## 2014-01-27 DIAGNOSIS — Z5181 Encounter for therapeutic drug level monitoring: Secondary | ICD-10-CM

## 2014-01-27 DIAGNOSIS — I4891 Unspecified atrial fibrillation: Secondary | ICD-10-CM

## 2014-01-27 DIAGNOSIS — Z7901 Long term (current) use of anticoagulants: Secondary | ICD-10-CM

## 2014-01-27 LAB — POCT INR: INR: 2.4

## 2014-02-08 ENCOUNTER — Ambulatory Visit: Payer: Medicare Other | Admitting: Family Medicine

## 2014-03-03 ENCOUNTER — Ambulatory Visit (INDEPENDENT_AMBULATORY_CARE_PROVIDER_SITE_OTHER): Payer: Medicare Other | Admitting: *Deleted

## 2014-03-03 DIAGNOSIS — Z5181 Encounter for therapeutic drug level monitoring: Secondary | ICD-10-CM

## 2014-03-03 DIAGNOSIS — Z7901 Long term (current) use of anticoagulants: Secondary | ICD-10-CM

## 2014-03-03 DIAGNOSIS — I4891 Unspecified atrial fibrillation: Secondary | ICD-10-CM

## 2014-03-03 LAB — POCT INR: INR: 2.5

## 2014-03-04 ENCOUNTER — Ambulatory Visit (INDEPENDENT_AMBULATORY_CARE_PROVIDER_SITE_OTHER): Payer: Medicare Other | Admitting: Family Medicine

## 2014-03-04 ENCOUNTER — Encounter: Payer: Self-pay | Admitting: Family Medicine

## 2014-03-04 VITALS — BP 110/78 | HR 74 | Temp 97.6°F | Wt 160.0 lb

## 2014-03-04 DIAGNOSIS — R319 Hematuria, unspecified: Secondary | ICD-10-CM

## 2014-03-04 LAB — POCT URINALYSIS DIPSTICK
Bilirubin, UA: NEGATIVE
GLUCOSE UA: NEGATIVE
KETONES UA: NEGATIVE
Nitrite, UA: POSITIVE
Protein, UA: NEGATIVE
Spec Grav, UA: 1.015
UROBILINOGEN UA: 0.2
pH, UA: 5

## 2014-03-04 MED ORDER — CEPHALEXIN 500 MG PO CAPS: 500.0000 mg | ORAL_CAPSULE | Freq: Three times a day (TID) | ORAL | Status: DC

## 2014-03-04 NOTE — Progress Notes (Signed)
   Subjective:    Patient ID: Angie Sullivan, female    DOB: 1934-03-22, 80 y.o.   MRN: [id number]  Hematuria Irritative symptoms include frequency. Associated symptoms include dysuria. Pertinent negatives include no abdominal pain, chills, fever, nausea or vomiting.    Patient seen with possibly some mild dysuria past couple days. Did notice that her urine looks " darker" than usual. She's not had any fevers or chills. Patient is a poor historian. Caregiver accompanies her. She's not had any nausea or vomiting. No flank pain. Her chronic problems include history of hypertension, atrial fibrillation, hyperlipidemia. Check long-term Coumadin and had INR yesterday 2.5. No known antibiotic allergies.  Past Medical History  Diagnosis Date  . Hypertension   . Hyperlipidemia   . Osteoporosis     last dexa 09/19/09  . Chronic atrial fibrillation   . Diverticulosis     clubbing polyps, internal hemorrhoids  . Pedal edema   . Dementia    Past Surgical History  Procedure Laterality Date  . Colonoscopy  09 29 06  . Cataract extraction    . Breast lumpectomy    . Tubal ligation      reports that she has never smoked. She has never used smokeless tobacco. She reports that she does not drink alcohol or use illicit drugs. family history includes Arrhythmia in an other family member. No Known Allergies   Review of Systems  Constitutional: Negative for fever, chills, appetite change and unexpected weight change.  Gastrointestinal: Negative for nausea, vomiting, abdominal pain, diarrhea and constipation.  Endocrine: Negative for polydipsia and polyuria.  Genitourinary: Positive for dysuria, frequency and hematuria.  Musculoskeletal: Negative for back pain.  Neurological: Negative for dizziness.       Objective:   Physical Exam  Constitutional: She appears well-developed and well-nourished.  Cardiovascular: Normal rate.   Irregular rhythm  Pulmonary/Chest: Effort normal and breath sounds  normal. No respiratory distress. She has no wheezes. She has no rales.  Musculoskeletal:  No flank tenderness          Assessment & Plan:  Dysuria. Probable UTI. She has positive nitrites positive leukocytes and trace blood on urine dipstick. Urine culture sent. Keflex 500 mg 3 times a day for 7 days pending culture results. Monitor INR closely

## 2014-03-04 NOTE — Progress Notes (Signed)
Pre visit review using our clinic review tool, if applicable. No additional management support is needed unless otherwise documented below in the visit note. 

## 2014-03-04 NOTE — Patient Instructions (Signed)
Urinary Tract Infection  Urinary tract infections (UTIs) can develop anywhere along your urinary tract. Your urinary tract is your body's drainage system for removing wastes and extra water. Your urinary tract includes two kidneys, two ureters, a bladder, and a urethra. Your kidneys are a pair of bean-shaped organs. Each kidney is about the size of your fist. They are located below your ribs, one on each side of your spine.  CAUSES  Infections are caused by microbes, which are microscopic organisms, including fungi, viruses, and bacteria. These organisms are so small that they can only be seen through a microscope. Bacteria are the microbes that most commonly cause UTIs.  SYMPTOMS   Symptoms of UTIs may vary by age and gender of the patient and by the location of the infection. Symptoms in young women typically include a frequent and intense urge to urinate and a painful, burning feeling in the bladder or urethra during urination. Older women and men are more likely to be tired, shaky, and weak and have muscle aches and abdominal pain. A fever may mean the infection is in your kidneys. Other symptoms of a kidney infection include pain in your back or sides below the ribs, nausea, and vomiting.  DIAGNOSIS  To diagnose a UTI, your caregiver will ask you about your symptoms. Your caregiver also will ask to provide a urine sample. The urine sample will be tested for bacteria and white blood cells. White blood cells are made by your body to help fight infection.  TREATMENT   Typically, UTIs can be treated with medication. Because most UTIs are caused by a bacterial infection, they usually can be treated with the use of antibiotics. The choice of antibiotic and length of treatment depend on your symptoms and the type of bacteria causing your infection.  HOME CARE INSTRUCTIONS   If you were prescribed antibiotics, take them exactly as your caregiver instructs you. Finish the medication even if you feel better after you  have only taken some of the medication.   Drink enough water and fluids to keep your urine clear or pale yellow.   Avoid caffeine, tea, and carbonated beverages. They tend to irritate your bladder.   Empty your bladder often. Avoid holding urine for long periods of time.   Empty your bladder before and after sexual intercourse.   After a bowel movement, women should cleanse from front to back. Use each tissue only once.  SEEK MEDICAL CARE IF:    You have back pain.   You develop a fever.   Your symptoms do not begin to resolve within 3 days.  SEEK IMMEDIATE MEDICAL CARE IF:    You have severe back pain or lower abdominal pain.   You develop chills.   You have nausea or vomiting.   You have continued burning or discomfort with urination.  MAKE SURE YOU:    Understand these instructions.   Will watch your condition.   Will get help right away if you are not doing well or get worse.  Document Released: 08/22/2005 Document Revised: 05/13/2012 Document Reviewed: 12/21/2011  ExitCare Patient Information 2014 [employer].

## 2014-03-06 LAB — URINE CULTURE: Colony Count: >=100000

## 2014-03-31 ENCOUNTER — Other Ambulatory Visit: Payer: Self-pay | Admitting: Family Medicine

## 2014-04-01 ENCOUNTER — Other Ambulatory Visit: Payer: Self-pay | Admitting: Family Medicine

## 2014-04-12 ENCOUNTER — Encounter: Payer: Self-pay | Admitting: Family Medicine

## 2014-04-12 ENCOUNTER — Ambulatory Visit (INDEPENDENT_AMBULATORY_CARE_PROVIDER_SITE_OTHER): Payer: Medicare Other | Admitting: Family Medicine

## 2014-04-12 VITALS — BP 97/62 | HR 79 | Temp 98.6°F | Ht 64.0 in | Wt 166.0 lb

## 2014-04-12 DIAGNOSIS — R609 Edema, unspecified: Secondary | ICD-10-CM

## 2014-04-12 DIAGNOSIS — F0390 Unspecified dementia without behavioral disturbance: Secondary | ICD-10-CM

## 2014-04-12 DIAGNOSIS — R600 Localized edema: Secondary | ICD-10-CM

## 2014-04-12 MED ORDER — METOPROLOL TARTRATE 50 MG PO TABS: 25.0000 mg | ORAL_TABLET | Freq: Two times a day (BID) | ORAL | Status: DC

## 2014-04-12 MED ORDER — FUROSEMIDE 40 MG PO TABS: 40.0000 mg | ORAL_TABLET | Freq: Two times a day (BID) | ORAL | Status: DC

## 2014-04-12 NOTE — Progress Notes (Signed)
   Subjective:    Patient ID: Angie SnideFay V Sullivan, female    DOB: 26-Jul-1934, [age] y.o.   MRN: [id number]  HPI Here for 4 weeks of increased SOB and swelling in the feet and lower legs. No chest pains. Her renal function last December was normal. She is in chronic atrial fib but the rate is well controlled. Her last ECHO in 2009 showed normal LV function.    Review of Systems  Constitutional: Negative.   Respiratory: Positive for shortness of breath. Negative for cough, choking, chest tightness and wheezing.   Cardiovascular: Positive for leg swelling. Negative for chest pain and palpitations.       Objective:   Physical Exam  Constitutional: She appears well-developed and well-nourished.  Cardiovascular: Normal rate, normal heart sounds and intact distal pulses.   No murmur heard. Irregular rhythm   Pulmonary/Chest: Effort normal and breath sounds normal. No respiratory distress. She has no wheezes. She has no rales.  Musculoskeletal:  2+ edema in both lower legs           Assessment & Plan:  Increase Lasix to 40 mg bid. Decrease Metoprolol to 25 mg bid. Set up another ECHO soon

## 2014-04-12 NOTE — Progress Notes (Signed)
Pre visit review using our clinic review tool, if applicable. No additional management support is needed unless otherwise documented below in the visit note. 

## 2014-04-13 ENCOUNTER — Telehealth: Payer: Self-pay | Admitting: Family Medicine

## 2014-04-13 MED ORDER — AZITHROMYCIN 250 MG PO TABS: ORAL_TABLET | ORAL | Status: DC

## 2014-04-13 NOTE — Telephone Encounter (Signed)
I sent script e-scribe and spoke with pt's daughter. 

## 2014-04-13 NOTE — Telephone Encounter (Signed)
Patient Information:  Caller Name: Windell MouldingRuth  Phone: [phone]  Patient: Angie Sullivan, Angie Sullivan  Gender: Female  DOB: 02-29-1934  Age: 80 Years  PCP: Gershon CraneFry, [facility](Family Practice)  Office Follow Up:  Does the office need to follow up with this patient?: Yes  Instructions For The Office: Fever last night = 100.0 AX and felt dizzy after walking to bathroom with assist. Afebrile today but had 2 Alleve last night @ 2300. Wanting to know if antibiotic needed and when scheduled to have ECG.  RN Note:  Please call Windell MouldingRuth with appnt time for ECG and if Dr. Clent RidgesFry would like her to start on antibiotic. [facility] [place]  Symptoms  Reason For Call & Symptoms: Seen in the office on 04/12/14  for deep cough that started 3 weeks ago and edema of ankes and feet.- onset 04/01/13. Last night had fever  = 100.0 Ax at 2300. Given Alleve 2 tabs PO and no fever this morning. No sore throat, but appeitite has been diminished since she has been staying with Windell MouldingRuth for past 3 days. She is needing help to get out of bed, she normally gets up on her own. Last night felt dizzy after helped to bathroom and got back to bed. No fever yet today but wondering what to do if it goes back up.  Reviewed Health History In EMR: Yes  Reviewed Medications In EMR: Yes  Reviewed Allergies In EMR: Yes  Reviewed Surgeries / Procedures: Yes  Date of Onset of Symptoms: 03/30/2014  Treatments Tried: Lasix increased/ Alleve  Treatments Tried Worked: No  Guideline(s) Used:  Cough  Disposition Per Guideline:   Go to Office Now  Reason For Disposition Reached:   Fever > 100.5 F (38.1 C) and over 78 years of age  Advice Given:  Coughing Spasms:  Drink warm fluids. Inhale warm mist (Reason: both relax the airway and loosen up the phlegm).  Suck on cough drops or hard candy to coat the irritated throat.  Prevent Dehydration:  Drink adequate liquids.  This will help soothe an irritated or dry throat and loosen up the phlegm.  Expected Course:   The expected course depends on what is causing the cough.  Viral bronchitis (chest cold) causes a cough that lasts 1 to 3 weeks. Sometimes you may cough up lots of phlegm (sputum, mucus). The mucus can normally be white, gray, yellow, or green.  Call Back If:  Difficulty breathing  Cough lasts more than 3 weeks  Fever lasts > 3 days  You become worse.  Fever Medicines:  Acetaminophen (e.g., Tylenol):  Regular Strength Tylenol: Take 650 mg (two 325 mg pills) by mouth every 4-6 hours as needed. Each Regular Strength Tylenol pill has 325 mg of acetaminophen.  Extra Strength Tylenol: Take 1,000 mg (two 500 mg pills) every 8 hours as needed. Each Extra Strength Tylenol pill has 500 mg of acetaminophen.  The most you should take each day is 3,000 mg (10 Regular Strength or 6 Extra Strength pills a day).  Extra Notes :  Use the lowest amount of medicine that makes your fever better.  Acetaminophen is thought to be safer than ibuprofen or naproxen in people over 78 years old. Acetaminophen is in many OTC and prescription medicines. It might be in more than one medicine that you are taking. You need to be careful and not take an overdose. An acetaminophen overdose can hurt the liver.  Patient Refused Recommendation:  Patient Requests Prescription  Fever and  dizziness last night. Wondering time/date for ECG.

## 2014-04-13 NOTE — Telephone Encounter (Signed)
Call in a Zpack  ?

## 2014-04-13 NOTE — Telephone Encounter (Signed)
Pt has a fever of 101 and Windell MouldingRuth would like to speak to you about medication she can give her.  I've transferred her to triage in the mean time.

## 2014-04-14 ENCOUNTER — Ambulatory Visit (INDEPENDENT_AMBULATORY_CARE_PROVIDER_SITE_OTHER): Payer: Medicare Other | Admitting: *Deleted

## 2014-04-14 DIAGNOSIS — Z5181 Encounter for therapeutic drug level monitoring: Secondary | ICD-10-CM

## 2014-04-14 DIAGNOSIS — I4891 Unspecified atrial fibrillation: Secondary | ICD-10-CM

## 2014-04-14 DIAGNOSIS — Z7901 Long term (current) use of anticoagulants: Secondary | ICD-10-CM

## 2014-04-14 LAB — POCT INR: INR: 4.4

## 2014-04-22 ENCOUNTER — Ambulatory Visit (INDEPENDENT_AMBULATORY_CARE_PROVIDER_SITE_OTHER): Payer: Medicare Other | Admitting: *Deleted

## 2014-04-22 DIAGNOSIS — Z7901 Long term (current) use of anticoagulants: Secondary | ICD-10-CM

## 2014-04-22 DIAGNOSIS — I4891 Unspecified atrial fibrillation: Secondary | ICD-10-CM

## 2014-04-22 DIAGNOSIS — Z5181 Encounter for therapeutic drug level monitoring: Secondary | ICD-10-CM

## 2014-04-22 LAB — POCT INR: INR: 2

## 2014-04-26 ENCOUNTER — Ambulatory Visit (HOSPITAL_COMMUNITY): Payer: Medicare Other | Attending: Cardiovascular Disease | Admitting: Cardiology

## 2014-04-26 DIAGNOSIS — R600 Localized edema: Secondary | ICD-10-CM

## 2014-04-26 DIAGNOSIS — I10 Essential (primary) hypertension: Secondary | ICD-10-CM | POA: Insufficient documentation

## 2014-04-26 DIAGNOSIS — I517 Cardiomegaly: Secondary | ICD-10-CM | POA: Insufficient documentation

## 2014-04-26 DIAGNOSIS — E785 Hyperlipidemia, unspecified: Secondary | ICD-10-CM | POA: Insufficient documentation

## 2014-04-26 DIAGNOSIS — I079 Rheumatic tricuspid valve disease, unspecified: Secondary | ICD-10-CM | POA: Insufficient documentation

## 2014-04-26 DIAGNOSIS — I4891 Unspecified atrial fibrillation: Secondary | ICD-10-CM | POA: Insufficient documentation

## 2014-04-26 DIAGNOSIS — R609 Edema, unspecified: Secondary | ICD-10-CM | POA: Insufficient documentation

## 2014-04-26 NOTE — Progress Notes (Signed)
Echo performed. 

## 2014-05-05 ENCOUNTER — Other Ambulatory Visit (HOSPITAL_COMMUNITY): Payer: Medicare Other

## 2014-05-19 ENCOUNTER — Ambulatory Visit (INDEPENDENT_AMBULATORY_CARE_PROVIDER_SITE_OTHER): Payer: Medicare Other | Admitting: *Deleted

## 2014-05-19 DIAGNOSIS — Z5181 Encounter for therapeutic drug level monitoring: Secondary | ICD-10-CM

## 2014-05-19 DIAGNOSIS — Z7901 Long term (current) use of anticoagulants: Secondary | ICD-10-CM

## 2014-05-19 DIAGNOSIS — I4891 Unspecified atrial fibrillation: Secondary | ICD-10-CM

## 2014-05-19 LAB — POCT INR: INR: 2.5

## 2014-06-16 ENCOUNTER — Ambulatory Visit (INDEPENDENT_AMBULATORY_CARE_PROVIDER_SITE_OTHER): Payer: Medicare Other | Admitting: *Deleted

## 2014-06-16 DIAGNOSIS — I4891 Unspecified atrial fibrillation: Secondary | ICD-10-CM

## 2014-06-16 DIAGNOSIS — Z5181 Encounter for therapeutic drug level monitoring: Secondary | ICD-10-CM

## 2014-06-16 DIAGNOSIS — Z7901 Long term (current) use of anticoagulants: Secondary | ICD-10-CM

## 2014-06-16 LAB — POCT INR: INR: 1.9

## 2014-07-14 ENCOUNTER — Ambulatory Visit (INDEPENDENT_AMBULATORY_CARE_PROVIDER_SITE_OTHER): Payer: Medicare Other | Admitting: *Deleted

## 2014-07-14 DIAGNOSIS — Z5181 Encounter for therapeutic drug level monitoring: Secondary | ICD-10-CM

## 2014-07-14 DIAGNOSIS — Z7901 Long term (current) use of anticoagulants: Secondary | ICD-10-CM

## 2014-07-14 DIAGNOSIS — I4891 Unspecified atrial fibrillation: Secondary | ICD-10-CM

## 2014-07-14 LAB — POCT INR: INR: 3

## 2014-08-11 ENCOUNTER — Ambulatory Visit (INDEPENDENT_AMBULATORY_CARE_PROVIDER_SITE_OTHER): Payer: Medicare Other | Admitting: *Deleted

## 2014-08-11 DIAGNOSIS — Z5181 Encounter for therapeutic drug level monitoring: Secondary | ICD-10-CM

## 2014-08-11 DIAGNOSIS — Z7901 Long term (current) use of anticoagulants: Secondary | ICD-10-CM

## 2014-08-11 DIAGNOSIS — I4891 Unspecified atrial fibrillation: Secondary | ICD-10-CM

## 2014-08-11 LAB — POCT INR: INR: 2.2

## 2014-08-25 ENCOUNTER — Encounter: Payer: Self-pay | Admitting: Family Medicine

## 2014-08-25 ENCOUNTER — Ambulatory Visit (INDEPENDENT_AMBULATORY_CARE_PROVIDER_SITE_OTHER): Payer: Medicare Other | Admitting: Family Medicine

## 2014-08-25 VITALS — BP 116/76 | HR 80 | Temp 98.3°F | Ht 64.0 in | Wt 176.1 lb

## 2014-08-25 DIAGNOSIS — R609 Edema, unspecified: Secondary | ICD-10-CM

## 2014-08-25 DIAGNOSIS — I4891 Unspecified atrial fibrillation: Secondary | ICD-10-CM

## 2014-08-25 DIAGNOSIS — I10 Essential (primary) hypertension: Secondary | ICD-10-CM

## 2014-08-25 DIAGNOSIS — Z23 Encounter for immunization: Secondary | ICD-10-CM

## 2014-08-25 LAB — CBC WITH DIFFERENTIAL/PLATELET
Basophils Absolute: 0 10*3/uL (ref 0.0–0.1)
Basophils Relative: 0.2 % (ref 0.0–3.0)
EOS PCT: 4.9 % (ref 0.0–5.0)
Eosinophils Absolute: 0.3 10*3/uL (ref 0.0–0.7)
HCT: 41.5 % (ref 36.0–46.0)
HEMOGLOBIN: 13.6 g/dL (ref 12.0–15.0)
LYMPHS ABS: 1.8 10*3/uL (ref 0.7–4.0)
Lymphocytes Relative: 33.1 % (ref 12.0–46.0)
MCHC: 32.7 g/dL (ref 30.0–36.0)
MCV: 93.8 fl (ref 78.0–100.0)
MONOS PCT: 6.7 % (ref 3.0–12.0)
Monocytes Absolute: 0.4 10*3/uL (ref 0.1–1.0)
Neutro Abs: 3 10*3/uL (ref 1.4–7.7)
Neutrophils Relative %: 55.1 % (ref 43.0–77.0)
Platelets: 194 10*3/uL (ref 150.0–400.0)
RBC: 4.43 Mil/uL (ref 3.87–5.11)
RDW: 15.1 % (ref 11.5–15.5)
WBC: 5.4 10*3/uL (ref 4.0–10.5)

## 2014-08-25 LAB — BASIC METABOLIC PANEL
BUN: 17 mg/dL (ref 6–23)
CHLORIDE: 103 meq/L (ref 96–112)
CO2: 30 mEq/L (ref 19–32)
Calcium: 9 mg/dL (ref 8.4–10.5)
Creatinine, Ser: 0.7 mg/dL (ref 0.4–1.2)
GFR: 87.33 mL/min (ref >60.00)
Glucose, Bld: 98 mg/dL (ref 70–99)
POTASSIUM: 3.4 meq/L — AB (ref 3.5–5.1)
Sodium: 139 mEq/L (ref 135–145)

## 2014-08-25 LAB — TSH: TSH: 0.39 u[IU]/mL (ref 0.35–4.50)

## 2014-08-25 MED ORDER — FUROSEMIDE 40 MG PO TABS: ORAL_TABLET | ORAL | Status: DC

## 2014-08-25 NOTE — Progress Notes (Signed)
   Subjective:    Patient ID: Angie Sullivan, female    DOB: Nov 04, 1934, 80 y.o.   MRN: [id number]  HPI Here with her daughter for continued swelling in the legs and feet. They are not painful. No SOB or cough. In May we increased her Lasix to 40 mg bid but she has still gained 10 lbs since then. She had an ECHO in June showing dilation of both atria and some LVH but preserved systolic function.    Review of Systems  Constitutional: Negative.   Respiratory: Negative.   Cardiovascular: Positive for leg swelling.  Neurological: Negative.        Objective:   Physical Exam  Constitutional: She appears well-developed and well-nourished.  Neck: No thyromegaly present.  Cardiovascular: Normal rate, normal heart sounds and intact distal pulses.  Exam reveals no gallop and no friction rub.   No murmur heard. Irregular rhythm   Pulmonary/Chest: Effort normal and breath sounds normal. No respiratory distress. She has no wheezes. She has no rales.  Musculoskeletal:  3+ edema in the right lower leg and 2+ edema in the left lower leg.   Lymphadenopathy:    She has no cervical adenopathy.          Assessment & Plan:  We will increase the Lasix to 40 mg 2 tabs in the AM and one tab in the PM. Get labs today

## 2014-08-25 NOTE — Progress Notes (Signed)
Pre visit review using our clinic review tool, if applicable. No additional management support is needed unless otherwise documented below in the visit note. 

## 2014-08-25 NOTE — Addendum Note (Signed)
Addended by: Aniceto BossNIMMONS, SYLVIA A on: 08/25/2014 02:35 PM   Modules accepted: Orders

## 2014-08-26 ENCOUNTER — Telehealth: Payer: Self-pay | Admitting: Family Medicine

## 2014-08-26 NOTE — Telephone Encounter (Signed)
emmi emailed °

## 2014-08-31 ENCOUNTER — Telehealth: Payer: Self-pay | Admitting: Family Medicine

## 2014-08-31 NOTE — Telephone Encounter (Signed)
Pt needs bloodwork results please call pt daugher lyn

## 2014-09-02 NOTE — Telephone Encounter (Signed)
I spoke with pt's daughter and gave lab results, she is on the release of information.

## 2014-09-03 ENCOUNTER — Other Ambulatory Visit: Payer: Self-pay | Admitting: Adult Health

## 2014-09-06 ENCOUNTER — Telehealth: Payer: Self-pay | Admitting: Adult Health

## 2014-09-06 MED ORDER — WARFARIN SODIUM 5 MG PO TABS: ORAL_TABLET | ORAL | Status: DC

## 2014-09-06 NOTE — Telephone Encounter (Signed)
Received fax refill request  Rx # [id number] Medication:  Warfarin 5 mg tab Qty 30 Sig:  Take one tablet by mouth once daily Physician:  Lyman BishopLawrence

## 2014-09-20 ENCOUNTER — Ambulatory Visit (INDEPENDENT_AMBULATORY_CARE_PROVIDER_SITE_OTHER): Payer: Medicare Other | Admitting: Family Medicine

## 2014-09-20 ENCOUNTER — Ambulatory Visit (INDEPENDENT_AMBULATORY_CARE_PROVIDER_SITE_OTHER)
Admission: RE | Admit: 2014-09-20 | Discharge: 2014-09-20 | Disposition: A | Discharge status: Home / Self Care | Payer: Medicare Other | Source: Ambulatory Visit | Attending: Family Medicine | Admitting: Family Medicine

## 2014-09-20 ENCOUNTER — Encounter: Payer: Self-pay | Admitting: Family Medicine

## 2014-09-20 VITALS — BP 113/96 | HR 84 | Temp 98.0°F | Ht 64.0 in | Wt 168.0 lb

## 2014-09-20 DIAGNOSIS — M5440 Lumbago with sciatica, unspecified side: Secondary | ICD-10-CM

## 2014-09-20 DIAGNOSIS — M25562 Pain in left knee: Secondary | ICD-10-CM

## 2014-09-20 DIAGNOSIS — R8290 Unspecified abnormal findings in urine: Secondary | ICD-10-CM

## 2014-09-20 LAB — POCT URINALYSIS DIPSTICK
BILIRUBIN UA: NEGATIVE
Glucose, UA: NEGATIVE
KETONES UA: NEGATIVE
Nitrite, UA: POSITIVE
PH UA: 6.5
Spec Grav, UA: 1.02
Urobilinogen, UA: 0.2

## 2014-09-20 MED ORDER — TRAMADOL HCL 50 MG PO TABS: 100.0000 mg | ORAL_TABLET | Freq: Four times a day (QID) | ORAL | Status: DC | PRN

## 2014-09-20 MED ORDER — SULFAMETHOXAZOLE-TMP DS 800-160 MG PO TABS: 1.0000 | ORAL_TABLET | Freq: Two times a day (BID) | ORAL | Status: DC

## 2014-09-20 NOTE — Progress Notes (Signed)
Pre visit review using our clinic review tool, if applicable. No additional management support is needed unless otherwise documented below in the visit note. 

## 2014-09-20 NOTE — Progress Notes (Signed)
   Subjective:    Patient ID: Angie SnideFay V Sullivan, female    DOB: 30-Nov-1933, 80 y.o.   MRN: [id number]  HPI Here for several things. First she fell 4 days ago and this was witnessed by her daughter. She seemed to slump to the floor and landed on her knees. She has had pain in the left knee and the lower back since then. Using Aspercreme and Aleve since then. Also she has had foul smelling urine for the past week. No fever.    Review of Systems  Constitutional: Negative.   Respiratory: Negative.   Cardiovascular: Negative.   Musculoskeletal: Positive for arthralgias, back pain and gait problem.       Objective:   Physical Exam  Constitutional: She appears well-developed and well-nourished.  Walks with a mild limp   Abdominal: Soft. Bowel sounds are normal. She exhibits no distension and no mass. There is no tenderness. There is no rebound and no guarding.  Musculoskeletal:  Mildly tender in the lower back with reduced ROM. There are extensive ecchymoses over the lateral left thigh and the over the left knee. Not tender at all. The knee is swollen. ROM is full           Assessment & Plan:  We will treat the UTI with Bactrim DS. Use Tramadol for pain. Get Xrays of the left knee and lumbar spine.

## 2014-09-22 ENCOUNTER — Encounter: Payer: Self-pay | Admitting: *Deleted

## 2014-09-27 ENCOUNTER — Telehealth: Payer: Self-pay | Admitting: Family Medicine

## 2014-09-27 MED ORDER — DONEPEZIL HCL 10 MG PO TABS: ORAL_TABLET | ORAL | Status: DC

## 2014-09-27 NOTE — Telephone Encounter (Signed)
[facility] - [place], [place] - [address] is requesting re-fill on donepezil (ARICEPT) 10 MG tablet

## 2014-09-27 NOTE — Telephone Encounter (Signed)
Per Dr. Fry, okay to refill for 1 year and I did send script e-scribe. 

## 2014-11-01 ENCOUNTER — Ambulatory Visit (INDEPENDENT_AMBULATORY_CARE_PROVIDER_SITE_OTHER): Payer: Medicare Other | Admitting: *Deleted

## 2014-11-01 ENCOUNTER — Telehealth: Payer: Self-pay | Admitting: Family Medicine

## 2014-11-01 DIAGNOSIS — Z5181 Encounter for therapeutic drug level monitoring: Secondary | ICD-10-CM

## 2014-11-01 DIAGNOSIS — I4891 Unspecified atrial fibrillation: Secondary | ICD-10-CM

## 2014-11-01 LAB — POCT INR: INR: 3.3

## 2014-11-01 NOTE — Telephone Encounter (Signed)
Doug physical therapist w.[facility] would like a  verbal order for physical therapy for twice a wk for 3 wks. For gait training, balancing etc

## 2014-11-01 NOTE — Telephone Encounter (Signed)
See coumadin note. 

## 2014-11-01 NOTE — Telephone Encounter (Signed)
Pt is monitored by cardiology. Message sent to Vashti HeyLisa Reid, RN

## 2014-11-01 NOTE — Telephone Encounter (Signed)
Angie Sullivan w/ [facility] states they picled up pt for nursing  and physical therapy. Also wanted I  & R checked. It was 3.3 [place] states pt got her dose wrong and was taking 5 mg.  Pt is supposed to be on 3.5 /day.  Nurse Angie Sullivan would like a cb.

## 2014-11-02 NOTE — Telephone Encounter (Signed)
Please set this up

## 2014-11-03 NOTE — Telephone Encounter (Signed)
Called and left a message for [place].  Gave verbal to proceed with PT twice a wk for 3 wks.  Instructed him to call back if any questions.

## 2014-11-09 ENCOUNTER — Telehealth: Payer: Self-pay | Admitting: Family Medicine

## 2014-11-09 ENCOUNTER — Telehealth: Payer: Self-pay | Admitting: *Deleted

## 2014-11-09 LAB — POCT INR: INR: 4.7

## 2014-11-09 NOTE — Telephone Encounter (Signed)
Pt is monitored by cardiology. Per Oran ReinPadonda, forward note to Vashti HeyLisa Reid, RN at cardiology   Unable to get Misty StanleyLisa on the phone. Atha StarksAdvised Billie, per Oran ReinPadonda, have pt hold coumadin today and await a call from [place], [place] tomorrow.   Message [facility] to [facility]

## 2014-11-09 NOTE — Telephone Encounter (Signed)
Billie calling back, requesting dosing for pt before she leaves the office this afternoon.

## 2014-11-09 NOTE — Telephone Encounter (Signed)
[facility] states on 11/09/14 at 2:30 patient's INR= 4.7

## 2014-11-09 NOTE — Telephone Encounter (Signed)
Angie Sullivan asking that you return her call

## 2014-11-10 ENCOUNTER — Ambulatory Visit (INDEPENDENT_AMBULATORY_CARE_PROVIDER_SITE_OTHER): Payer: Medicare Other | Admitting: *Deleted

## 2014-11-10 ENCOUNTER — Telehealth: Payer: Self-pay | Admitting: *Deleted

## 2014-11-10 DIAGNOSIS — Z5181 Encounter for therapeutic drug level monitoring: Secondary | ICD-10-CM

## 2014-11-10 DIAGNOSIS — I4891 Unspecified atrial fibrillation: Secondary | ICD-10-CM

## 2014-11-10 NOTE — Telephone Encounter (Signed)
Called and spoke with daughter.  Coumadin instructions given.  See coumadin note.

## 2014-11-10 NOTE — Telephone Encounter (Signed)
Done.  See previous note. 

## 2014-11-10 NOTE — Telephone Encounter (Signed)
Would like return phone call regarding patient / tgs

## 2014-11-10 NOTE — Telephone Encounter (Signed)
See coumadin note. 

## 2014-11-15 ENCOUNTER — Ambulatory Visit (INDEPENDENT_AMBULATORY_CARE_PROVIDER_SITE_OTHER): Payer: Medicare Other | Admitting: *Deleted

## 2014-11-15 DIAGNOSIS — Z5181 Encounter for therapeutic drug level monitoring: Secondary | ICD-10-CM

## 2014-11-15 DIAGNOSIS — I4891 Unspecified atrial fibrillation: Secondary | ICD-10-CM

## 2014-11-15 LAB — POCT INR: INR: 2.3

## 2014-11-18 DIAGNOSIS — M150 Primary generalized (osteo)arthritis: Secondary | ICD-10-CM | POA: Diagnosis not present

## 2014-11-18 DIAGNOSIS — R262 Difficulty in walking, not elsewhere classified: Secondary | ICD-10-CM | POA: Diagnosis not present

## 2014-11-18 DIAGNOSIS — S32000D Wedge compression fracture of unspecified lumbar vertebra, subsequent encounter for fracture with routine healing: Secondary | ICD-10-CM | POA: Diagnosis not present

## 2014-11-22 ENCOUNTER — Ambulatory Visit (INDEPENDENT_AMBULATORY_CARE_PROVIDER_SITE_OTHER): Payer: Medicare Other | Admitting: *Deleted

## 2014-11-22 DIAGNOSIS — Z5181 Encounter for therapeutic drug level monitoring: Secondary | ICD-10-CM

## 2014-11-22 DIAGNOSIS — I4891 Unspecified atrial fibrillation: Secondary | ICD-10-CM

## 2014-11-22 LAB — POCT INR: INR: 1.9

## 2014-12-06 ENCOUNTER — Ambulatory Visit (INDEPENDENT_AMBULATORY_CARE_PROVIDER_SITE_OTHER): Payer: Medicare Other | Admitting: *Deleted

## 2014-12-06 DIAGNOSIS — I4891 Unspecified atrial fibrillation: Secondary | ICD-10-CM

## 2014-12-06 DIAGNOSIS — Z7901 Long term (current) use of anticoagulants: Secondary | ICD-10-CM | POA: Diagnosis not present

## 2014-12-06 DIAGNOSIS — Z5181 Encounter for therapeutic drug level monitoring: Secondary | ICD-10-CM

## 2014-12-06 LAB — POCT INR: INR: 2.1

## 2014-12-13 ENCOUNTER — Ambulatory Visit (INDEPENDENT_AMBULATORY_CARE_PROVIDER_SITE_OTHER): Payer: Medicare Other | Admitting: Family Medicine

## 2014-12-13 ENCOUNTER — Encounter: Payer: Self-pay | Admitting: Family Medicine

## 2014-12-13 VITALS — BP 112/70 | Temp 98.8°F | Ht 64.0 in | Wt 146.0 lb

## 2014-12-13 DIAGNOSIS — F0390 Unspecified dementia without behavioral disturbance: Secondary | ICD-10-CM | POA: Diagnosis not present

## 2014-12-13 DIAGNOSIS — I4819 Other persistent atrial fibrillation: Secondary | ICD-10-CM

## 2014-12-13 DIAGNOSIS — I481 Persistent atrial fibrillation: Secondary | ICD-10-CM

## 2014-12-13 DIAGNOSIS — I10 Essential (primary) hypertension: Secondary | ICD-10-CM

## 2014-12-13 MED ORDER — POTASSIUM CHLORIDE CRYS ER 20 MEQ PO TBCR: 20.0000 meq | EXTENDED_RELEASE_TABLET | Freq: Two times a day (BID) | ORAL | Status: DC

## 2014-12-13 MED ORDER — METOPROLOL TARTRATE 25 MG PO TABS
25.0000 mg | ORAL_TABLET | Freq: Two times a day (BID) | ORAL | Status: DC
Start: 2014-12-13 — End: 2015-11-11

## 2014-12-13 MED ORDER — RALOXIFENE HCL 60 MG PO TABS: 60.0000 mg | ORAL_TABLET | Freq: Every day | ORAL | Status: DC

## 2014-12-13 NOTE — Progress Notes (Signed)
   Subjective:    Patient ID: Angie Sullivan, female    DOB: [birth year], 81 y.o.   MRN: [id number]  HPI Here  With her daughters for follow up. She was admitted to a [facility] [place] in early November for failure to thrive, and she did very well there. Her nutrition was miximized and she got PT and OT. She then spent one [facility] in [facility], a rehab facility. She has been home since 10-29-14, and she spends 2 weeks at a time with either her sister in [place] or her sister in [place]. Her appetite has improved and she is much stronger. She now can walk alone with her walker.    Review of Systems  Constitutional: Negative.   Respiratory: Negative.   Cardiovascular: Negative.        Objective:   Physical Exam  Constitutional: She appears well-developed and well-nourished.  Cardiovascular: Normal rate, regular rhythm, normal heart sounds and intact distal pulses.   Pulmonary/Chest: Effort normal and breath sounds normal.  Neurological: She is alert.  Oriented to self only           Assessment & Plan:  She seems to be doing well. Continue current regimen.

## 2014-12-13 NOTE — Progress Notes (Signed)
Pre visit review using our clinic review tool, if applicable. No additional management support is needed unless otherwise documented below in the visit note. 

## 2014-12-14 ENCOUNTER — Telehealth: Payer: Self-pay | Admitting: *Deleted

## 2014-12-14 MED ORDER — WARFARIN SODIUM 1 MG PO TABS: 1.0000 mg | ORAL_TABLET | ORAL | Status: DC

## 2014-12-14 MED ORDER — WARFARIN SODIUM 2.5 MG PO TABS: 2.5000 mg | ORAL_TABLET | Freq: Every day | ORAL | Status: DC

## 2014-12-14 NOTE — Telephone Encounter (Signed)
Pt needs warfarin called in 

## 2014-12-14 NOTE — Telephone Encounter (Signed)
Patient's daughter was instructed to call lisa in [place]. Patient is out of town and is needing to have her Rx for warfin called in to today.  Please contact Lyn before you call Rx in

## 2014-12-21 ENCOUNTER — Other Ambulatory Visit: Payer: Self-pay

## 2014-12-27 ENCOUNTER — Encounter: Payer: Self-pay | Admitting: Family Medicine

## 2014-12-27 ENCOUNTER — Ambulatory Visit (INDEPENDENT_AMBULATORY_CARE_PROVIDER_SITE_OTHER): Payer: Medicare Other | Admitting: *Deleted

## 2014-12-27 DIAGNOSIS — I4891 Unspecified atrial fibrillation: Secondary | ICD-10-CM

## 2014-12-27 DIAGNOSIS — Z7901 Long term (current) use of anticoagulants: Secondary | ICD-10-CM | POA: Diagnosis not present

## 2014-12-27 DIAGNOSIS — Z5181 Encounter for therapeutic drug level monitoring: Secondary | ICD-10-CM

## 2014-12-27 LAB — POCT INR: INR: 1.4

## 2014-12-27 NOTE — Addendum Note (Signed)
Addended by: Louanna RawEID, LISA M on: 12/27/2014 05:16 PM   Modules accepted: Level of Service

## 2015-01-03 ENCOUNTER — Ambulatory Visit (INDEPENDENT_AMBULATORY_CARE_PROVIDER_SITE_OTHER): Payer: Medicare Other | Admitting: *Deleted

## 2015-01-03 DIAGNOSIS — I480 Paroxysmal atrial fibrillation: Secondary | ICD-10-CM | POA: Diagnosis not present

## 2015-01-03 DIAGNOSIS — I4891 Unspecified atrial fibrillation: Secondary | ICD-10-CM

## 2015-01-03 DIAGNOSIS — Z7901 Long term (current) use of anticoagulants: Secondary | ICD-10-CM | POA: Diagnosis not present

## 2015-01-03 DIAGNOSIS — Z5181 Encounter for therapeutic drug level monitoring: Secondary | ICD-10-CM

## 2015-01-03 LAB — POCT INR: INR: 1.7

## 2015-01-12 ENCOUNTER — Other Ambulatory Visit: Payer: Self-pay

## 2015-01-12 DIAGNOSIS — Z1231 Encounter for screening mammogram for malignant neoplasm of breast: Secondary | ICD-10-CM

## 2015-01-24 ENCOUNTER — Ambulatory Visit (INDEPENDENT_AMBULATORY_CARE_PROVIDER_SITE_OTHER): Payer: Medicare Other | Admitting: *Deleted

## 2015-01-24 DIAGNOSIS — I4891 Unspecified atrial fibrillation: Secondary | ICD-10-CM | POA: Diagnosis not present

## 2015-01-24 DIAGNOSIS — Z7901 Long term (current) use of anticoagulants: Secondary | ICD-10-CM | POA: Diagnosis not present

## 2015-01-24 DIAGNOSIS — Z5181 Encounter for therapeutic drug level monitoring: Secondary | ICD-10-CM | POA: Diagnosis not present

## 2015-01-24 DIAGNOSIS — I480 Paroxysmal atrial fibrillation: Secondary | ICD-10-CM | POA: Diagnosis not present

## 2015-01-24 LAB — POCT INR: INR: 1.7

## 2015-01-27 ENCOUNTER — Ambulatory Visit
Admission: RE | Admit: 2015-01-27 | Discharge: 2015-01-27 | Disposition: A | Discharge status: Home / Self Care | Payer: Medicare Other | Source: Ambulatory Visit

## 2015-01-27 DIAGNOSIS — Z1231 Encounter for screening mammogram for malignant neoplasm of breast: Secondary | ICD-10-CM

## 2015-02-23 ENCOUNTER — Ambulatory Visit (INDEPENDENT_AMBULATORY_CARE_PROVIDER_SITE_OTHER): Payer: Medicare Other | Admitting: *Deleted

## 2015-02-23 DIAGNOSIS — I4891 Unspecified atrial fibrillation: Secondary | ICD-10-CM | POA: Diagnosis not present

## 2015-02-23 DIAGNOSIS — I480 Paroxysmal atrial fibrillation: Secondary | ICD-10-CM | POA: Diagnosis not present

## 2015-02-23 DIAGNOSIS — Z5181 Encounter for therapeutic drug level monitoring: Secondary | ICD-10-CM | POA: Diagnosis not present

## 2015-02-23 DIAGNOSIS — Z7901 Long term (current) use of anticoagulants: Secondary | ICD-10-CM

## 2015-02-23 LAB — POCT INR: INR: 2.6

## 2015-03-16 ENCOUNTER — Telehealth: Payer: Self-pay | Admitting: Family Medicine

## 2015-03-16 NOTE — Telephone Encounter (Signed)
Pt request refill furosemide (LASIX) 40 MG tablet 90 day supply / 270 tabs / 2 in am and 1 pm  Pt is staying w/ daughter. Please send to a different pharm this time: [facility] [phone]  [address] [place], [place]

## 2015-03-17 MED ORDER — FUROSEMIDE 40 MG PO TABS: ORAL_TABLET | ORAL | Status: DC

## 2015-03-17 NOTE — Telephone Encounter (Signed)
I sent script e-scribe. 

## 2015-03-23 ENCOUNTER — Ambulatory Visit (INDEPENDENT_AMBULATORY_CARE_PROVIDER_SITE_OTHER): Payer: Medicare Other | Admitting: *Deleted

## 2015-03-23 DIAGNOSIS — I480 Paroxysmal atrial fibrillation: Secondary | ICD-10-CM

## 2015-03-23 DIAGNOSIS — Z5181 Encounter for therapeutic drug level monitoring: Secondary | ICD-10-CM | POA: Diagnosis not present

## 2015-03-23 DIAGNOSIS — Z7901 Long term (current) use of anticoagulants: Secondary | ICD-10-CM | POA: Diagnosis not present

## 2015-03-23 DIAGNOSIS — I4891 Unspecified atrial fibrillation: Secondary | ICD-10-CM

## 2015-03-23 LAB — POCT INR: INR: 2.6

## 2015-04-20 ENCOUNTER — Ambulatory Visit (INDEPENDENT_AMBULATORY_CARE_PROVIDER_SITE_OTHER): Payer: Medicare Other | Admitting: *Deleted

## 2015-04-20 DIAGNOSIS — Z7901 Long term (current) use of anticoagulants: Secondary | ICD-10-CM

## 2015-04-20 DIAGNOSIS — I4891 Unspecified atrial fibrillation: Secondary | ICD-10-CM

## 2015-04-20 DIAGNOSIS — Z5181 Encounter for therapeutic drug level monitoring: Secondary | ICD-10-CM

## 2015-04-20 LAB — POCT INR: INR: 2.9

## 2015-04-20 MED ORDER — WARFARIN SODIUM 1 MG PO TABS: ORAL_TABLET | ORAL | Status: DC

## 2015-04-20 MED ORDER — WARFARIN SODIUM 2.5 MG PO TABS: ORAL_TABLET | ORAL | Status: DC

## 2015-05-19 DIAGNOSIS — R310 Gross hematuria: Secondary | ICD-10-CM | POA: Diagnosis not present

## 2015-06-20 ENCOUNTER — Ambulatory Visit (INDEPENDENT_AMBULATORY_CARE_PROVIDER_SITE_OTHER): Payer: Medicare Other | Admitting: *Deleted

## 2015-06-20 DIAGNOSIS — Z5181 Encounter for therapeutic drug level monitoring: Secondary | ICD-10-CM

## 2015-06-20 DIAGNOSIS — Z7901 Long term (current) use of anticoagulants: Secondary | ICD-10-CM | POA: Diagnosis not present

## 2015-06-20 DIAGNOSIS — I4891 Unspecified atrial fibrillation: Secondary | ICD-10-CM | POA: Diagnosis not present

## 2015-06-20 LAB — POCT INR: INR: 3.3

## 2015-06-27 ENCOUNTER — Other Ambulatory Visit: Payer: Self-pay | Admitting: *Deleted

## 2015-06-27 MED ORDER — FUROSEMIDE 40 MG PO TABS: ORAL_TABLET | ORAL | Status: DC

## 2015-07-18 ENCOUNTER — Encounter: Payer: Self-pay | Admitting: Gastroenterology

## 2015-07-20 IMAGING — CR DG LUMBAR SPINE COMPLETE 4+V
5 series · 5 of 5 positions shown · non-contrast
Comparison: 09/06/2008

CLINICAL DATA: Low back pain with sciatica, recent fall

EXAM:
LUMBAR SPINE - COMPLETE 4+ VIEW

[view not recorded (1 of 5)]
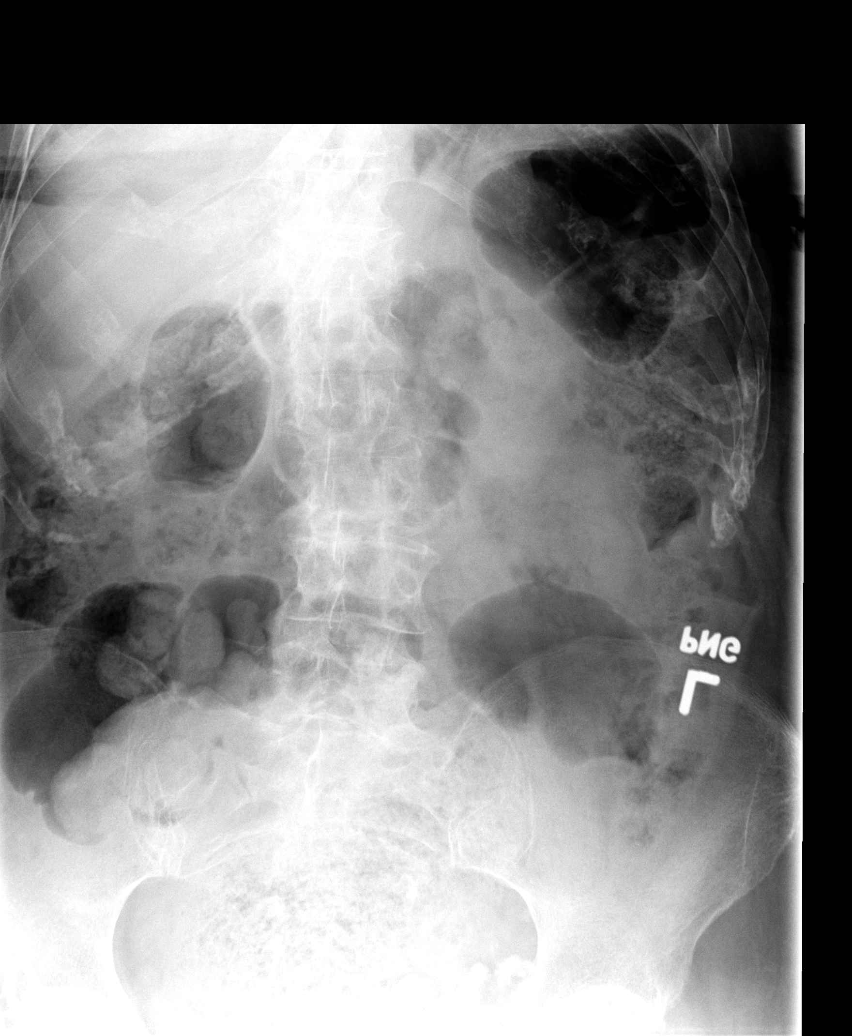

[view not recorded (2 of 5)]
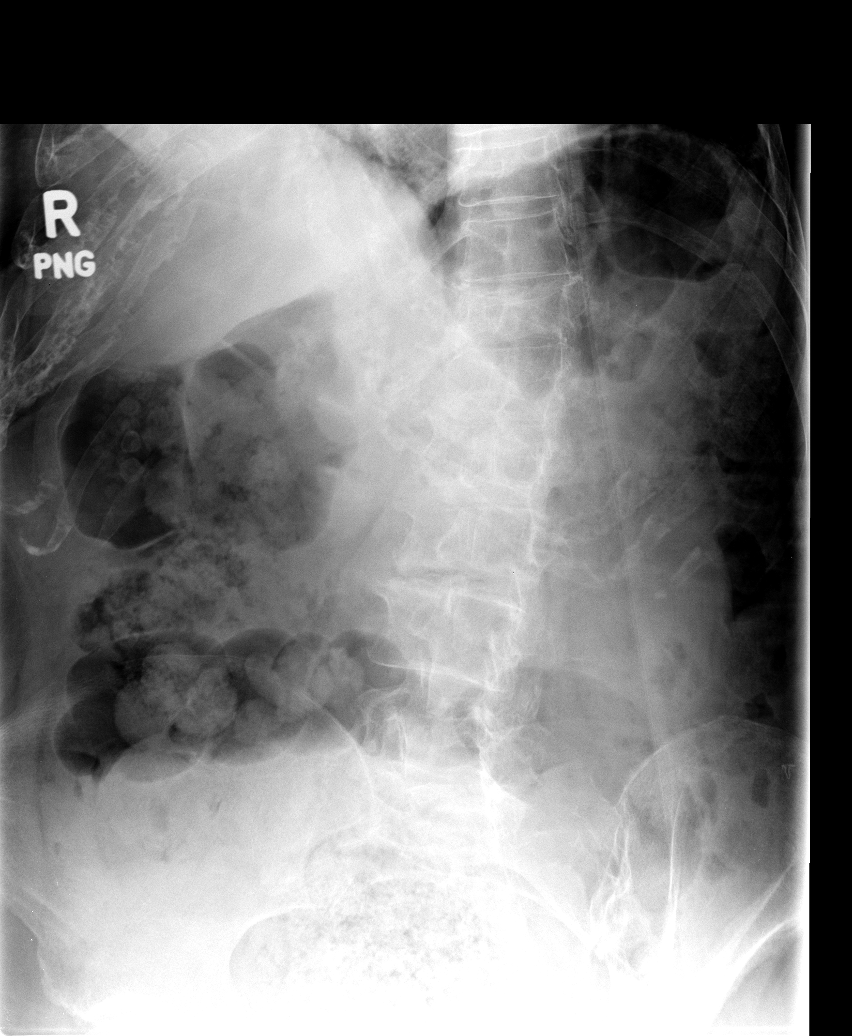

[view not recorded (3 of 5)]
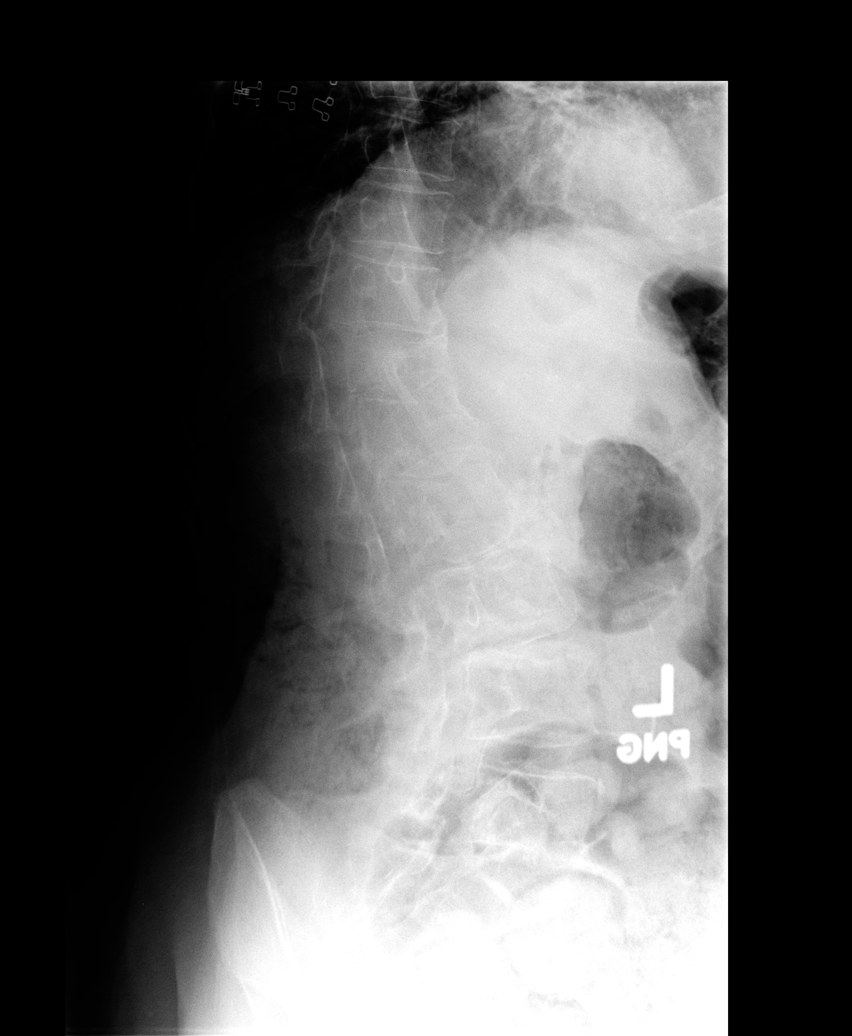

[view not recorded (4 of 5)]
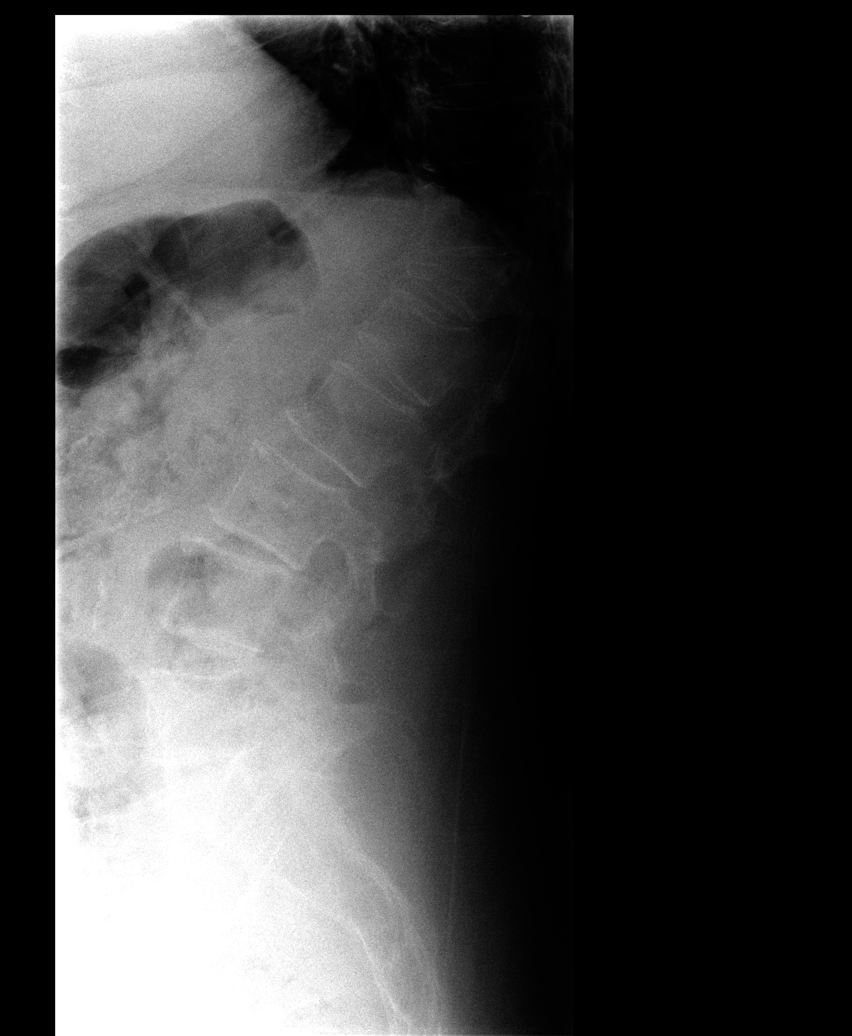

[view not recorded (5 of 5)]
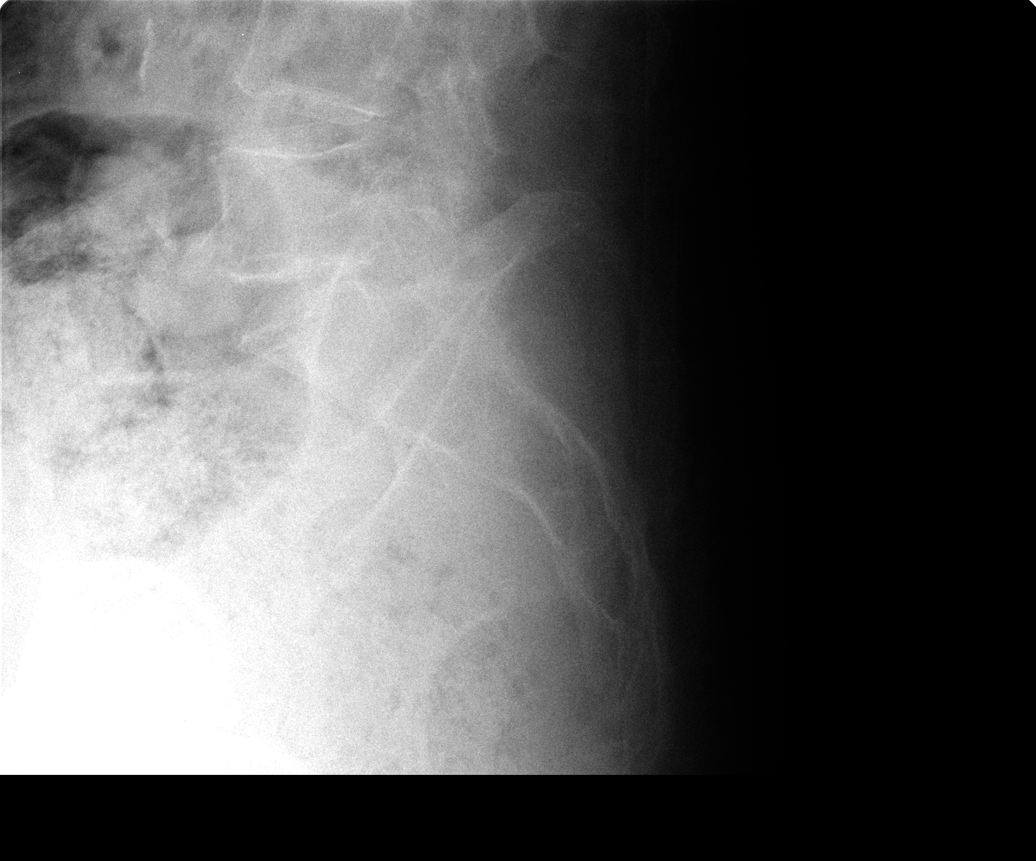

[5 of 5 positions shown; findings below may reference images not displayed]

FINDINGS: Study is limited by diffuse osteopenia and abundant colonic stool.
Abundant stool noted in distal colon. Multilevel mild degenerative
changes lumbar spine. Mild disc space flattening at L5-S1 level.
There is mild compression deformity upper endplate of L1 vertebral
body. Moderate to significant compression fracture upper endplate of
T12 vertebral body of indeterminate age. Clinical correlation is
necessary. If recent fractures are suspected further correlation
with MRI is recommended.
IMPRESSION: Mild disc space flattening at L5-S1 level. There is mild compression
deformity upper endplate of L1 vertebral body. Moderate to
significant compression fracture upper endplate of T12 vertebral
body of indeterminate age. Clinical correlation is necessary. If
recent fractures are suspected further correlation with MRI is
recommended.

## 2015-07-20 IMAGING — CR DG KNEE COMPLETE 4+V*L*
4 series · 4 of 4 positions shown · non-contrast
Comparison: None.

CLINICAL DATA: Swelling, lateral aspect knot recent fall, left knee
pain

EXAM:
LEFT KNEE - COMPLETE 4+ VIEW

[view not recorded (1 of 4)]
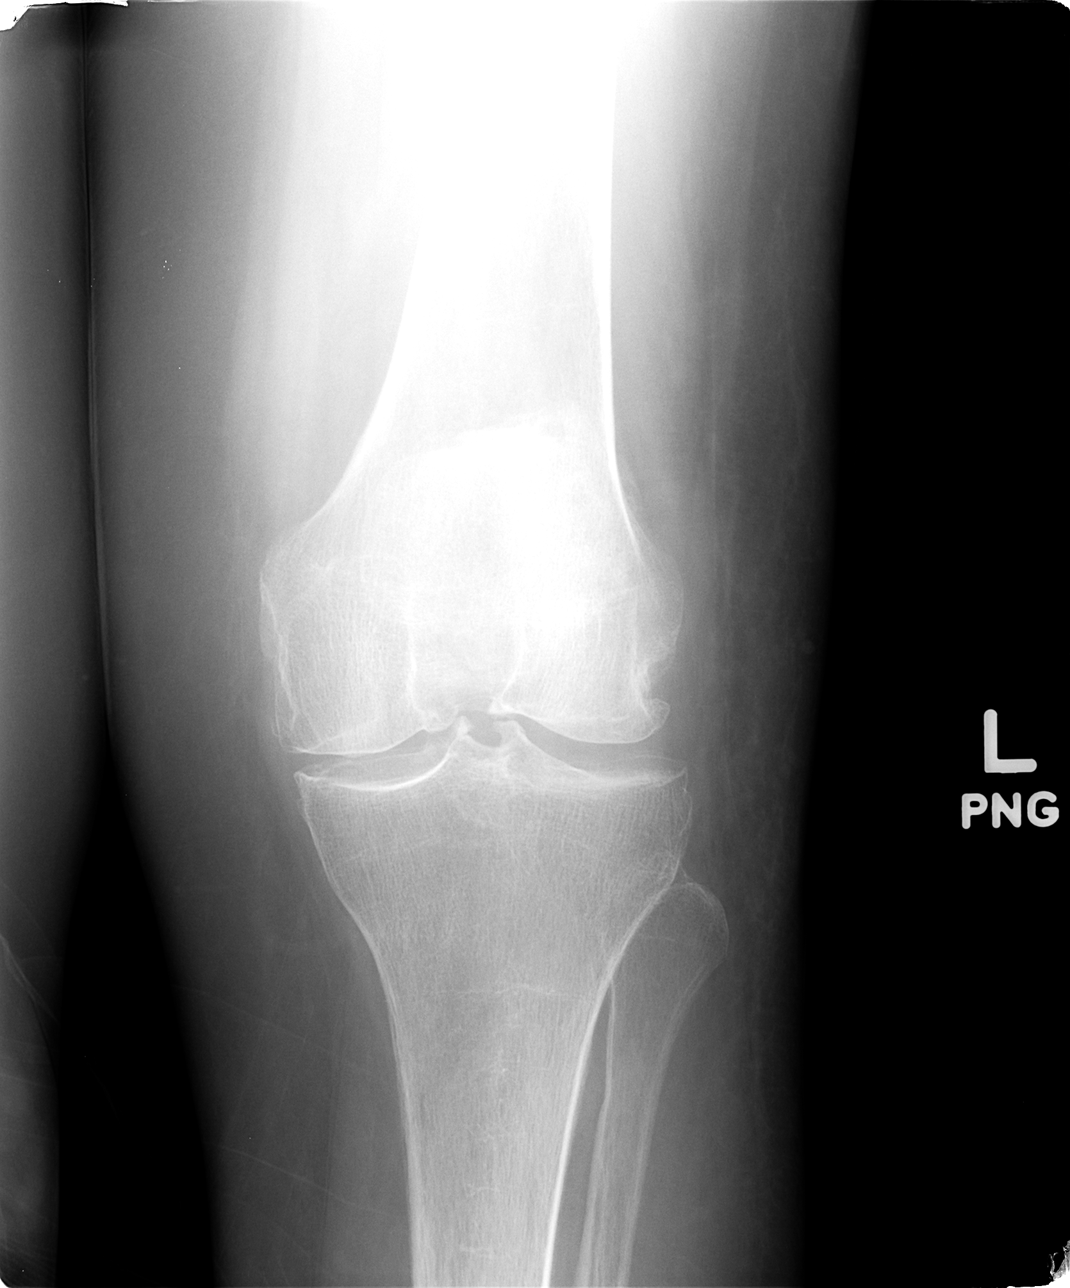

[view not recorded (2 of 4)]
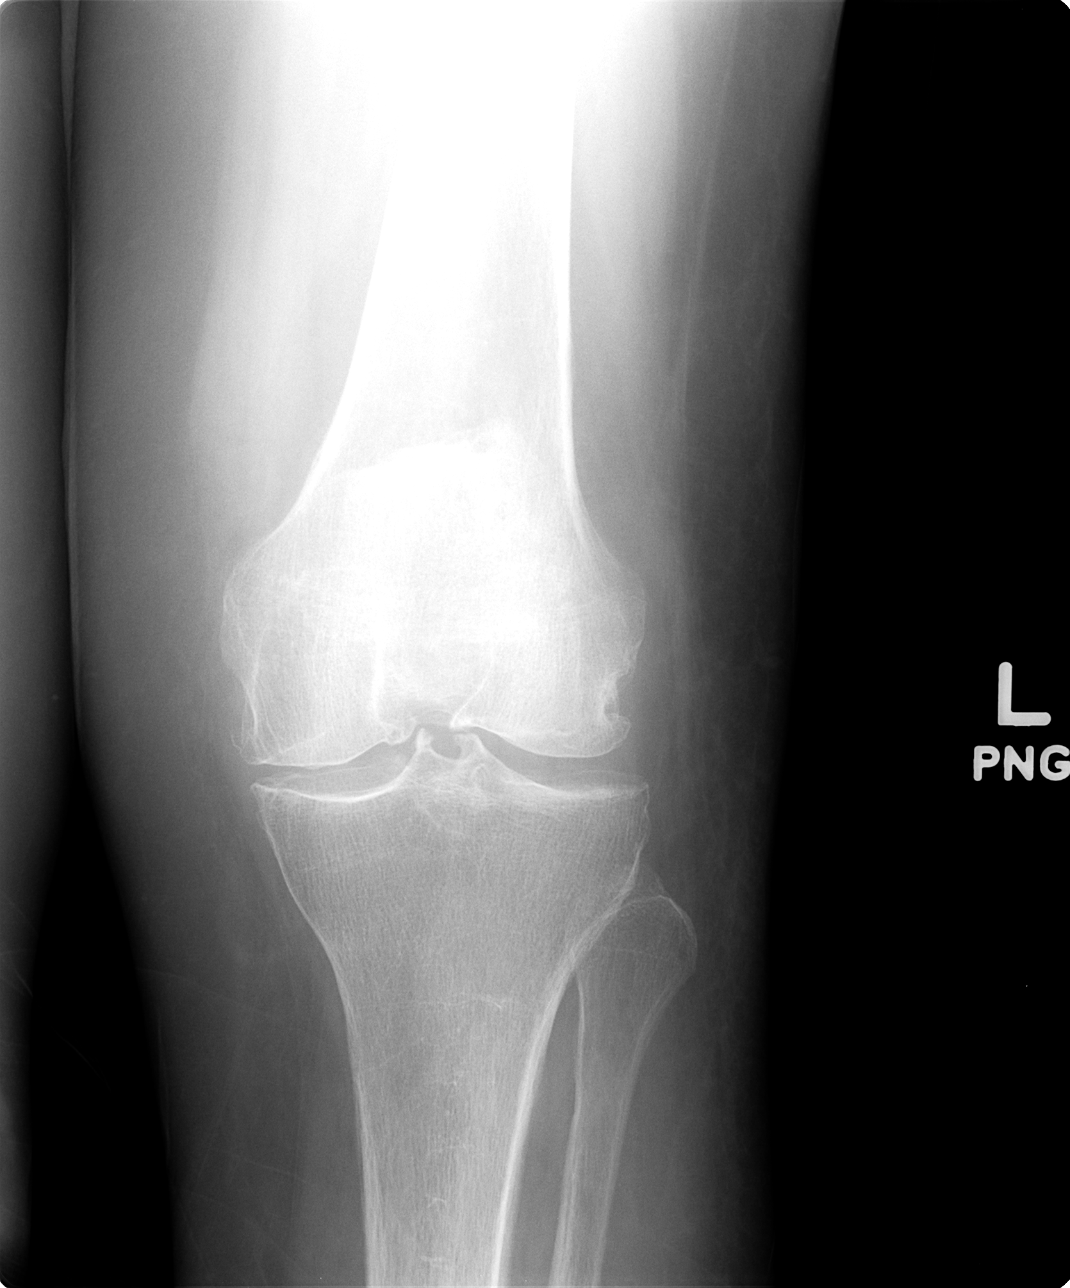

[view not recorded (3 of 4)]
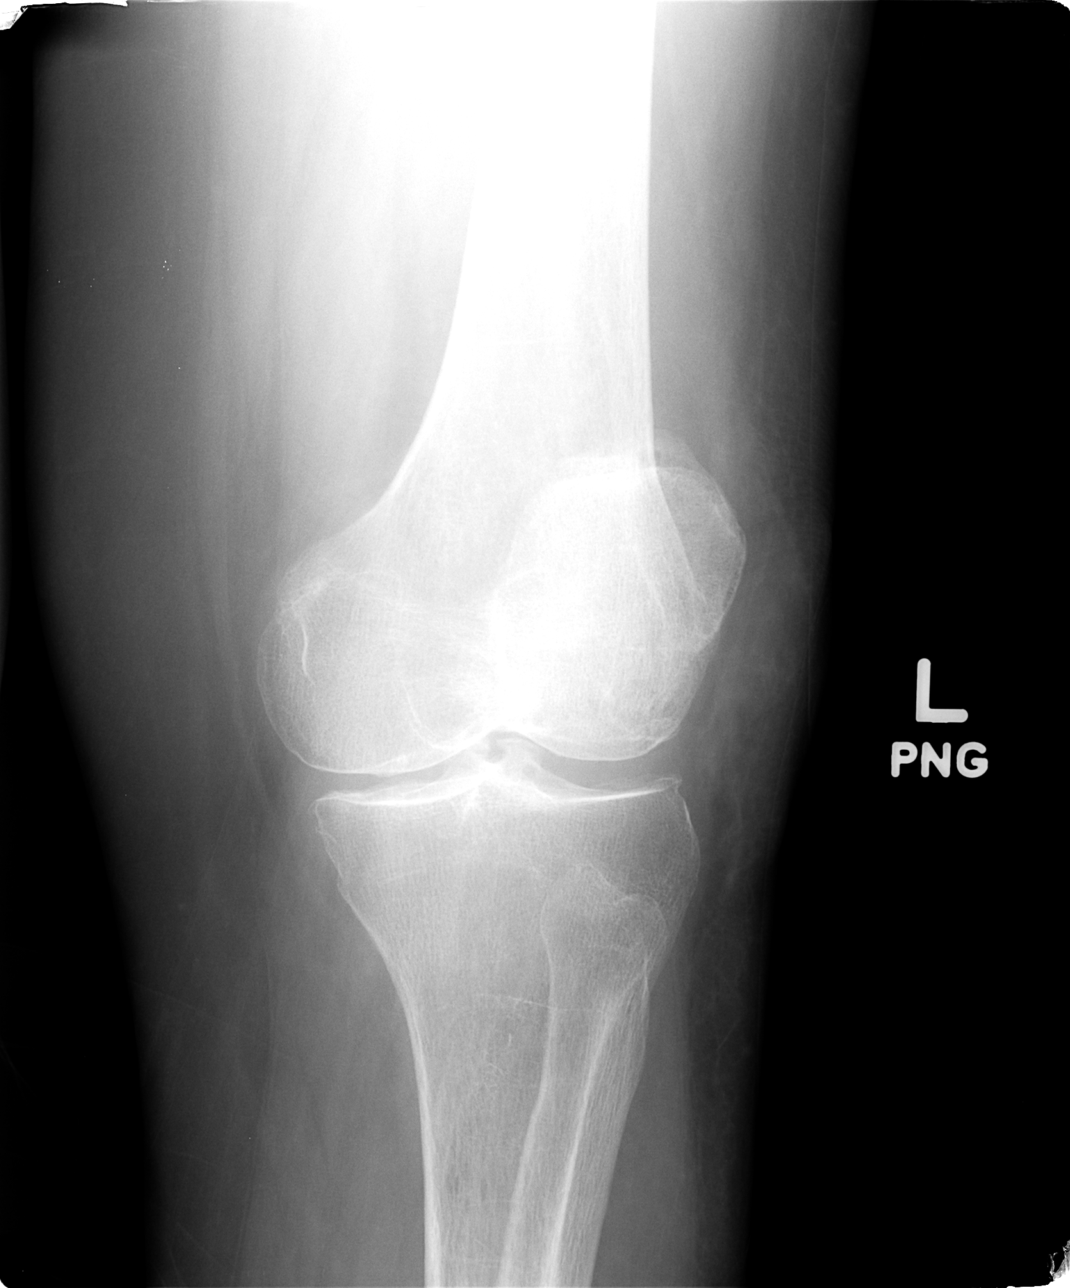

[view not recorded (4 of 4)]
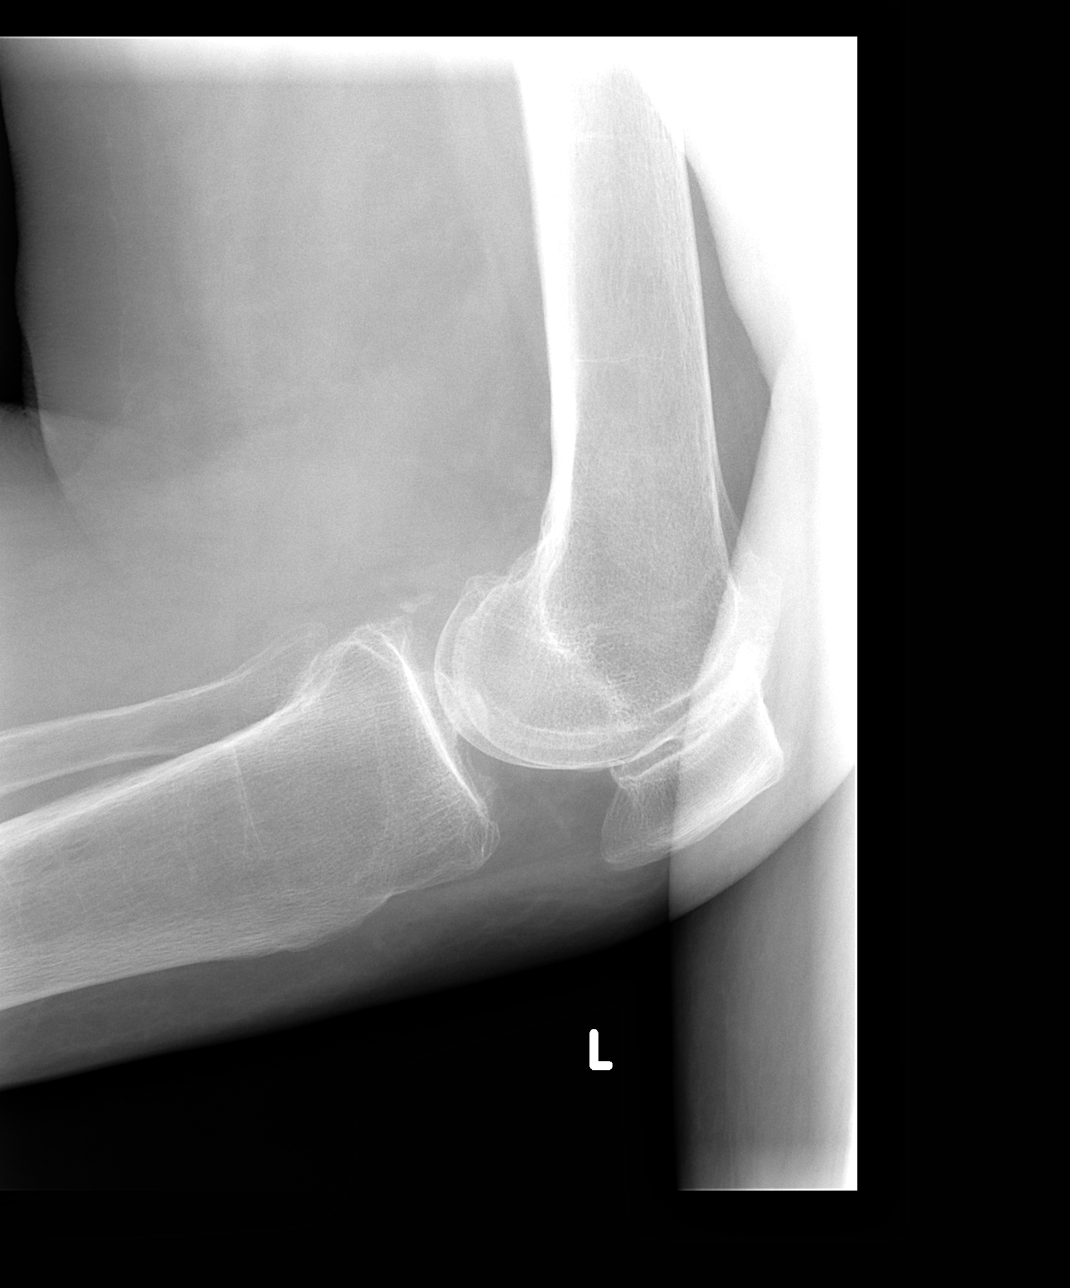

[4 of 4 positions shown; findings below may reference images not displayed]

FINDINGS: Four views of the left knee submitted. No acute fracture or
subluxation. There is narrowing of medial joint compartment. Mild
spurring of medial tibial plateau. See spurring of lateral femoral
condyle. Narrowing of patellofemoral joint space. There is diffuse
osteopenia.
IMPRESSION: No acute fracture or subluxation. Diffuse osteopenia. Osteoarthritic
changes as described above.

## 2015-07-26 ENCOUNTER — Ambulatory Visit (INDEPENDENT_AMBULATORY_CARE_PROVIDER_SITE_OTHER): Payer: Medicare Other | Admitting: *Deleted

## 2015-07-26 DIAGNOSIS — I4891 Unspecified atrial fibrillation: Secondary | ICD-10-CM | POA: Diagnosis not present

## 2015-07-26 DIAGNOSIS — Z5181 Encounter for therapeutic drug level monitoring: Secondary | ICD-10-CM

## 2015-07-26 DIAGNOSIS — Z7901 Long term (current) use of anticoagulants: Secondary | ICD-10-CM

## 2015-07-26 LAB — POCT INR: INR: 2.7

## 2015-08-29 ENCOUNTER — Telehealth: Payer: Self-pay | Admitting: *Deleted

## 2015-08-29 NOTE — Telephone Encounter (Signed)
Pt is out of her Coumadin  --pt uses [facility]

## 2015-08-30 MED ORDER — WARFARIN SODIUM 1 MG PO TABS: ORAL_TABLET | ORAL | Status: DC

## 2015-09-05 ENCOUNTER — Ambulatory Visit (INDEPENDENT_AMBULATORY_CARE_PROVIDER_SITE_OTHER): Payer: Medicare Other | Admitting: *Deleted

## 2015-09-05 DIAGNOSIS — Z5181 Encounter for therapeutic drug level monitoring: Secondary | ICD-10-CM

## 2015-09-05 DIAGNOSIS — Z7901 Long term (current) use of anticoagulants: Secondary | ICD-10-CM

## 2015-09-05 DIAGNOSIS — I4891 Unspecified atrial fibrillation: Secondary | ICD-10-CM | POA: Diagnosis not present

## 2015-09-05 LAB — POCT INR: INR: 3.9

## 2015-09-12 ENCOUNTER — Ambulatory Visit (INDEPENDENT_AMBULATORY_CARE_PROVIDER_SITE_OTHER): Payer: Medicare Other | Admitting: *Deleted

## 2015-09-12 DIAGNOSIS — Z7901 Long term (current) use of anticoagulants: Secondary | ICD-10-CM

## 2015-09-12 DIAGNOSIS — I4891 Unspecified atrial fibrillation: Secondary | ICD-10-CM | POA: Diagnosis not present

## 2015-09-12 DIAGNOSIS — Z5181 Encounter for therapeutic drug level monitoring: Secondary | ICD-10-CM

## 2015-09-12 LAB — POCT INR: INR: 2.4

## 2015-09-27 ENCOUNTER — Other Ambulatory Visit: Payer: Self-pay | Admitting: Family Medicine

## 2015-10-03 ENCOUNTER — Ambulatory Visit (INDEPENDENT_AMBULATORY_CARE_PROVIDER_SITE_OTHER): Payer: Medicare Other | Admitting: *Deleted

## 2015-10-03 DIAGNOSIS — Z7901 Long term (current) use of anticoagulants: Secondary | ICD-10-CM | POA: Diagnosis not present

## 2015-10-03 DIAGNOSIS — Z5181 Encounter for therapeutic drug level monitoring: Secondary | ICD-10-CM

## 2015-10-03 DIAGNOSIS — I4891 Unspecified atrial fibrillation: Secondary | ICD-10-CM | POA: Diagnosis not present

## 2015-10-03 LAB — POCT INR: INR: 5.5

## 2015-10-10 ENCOUNTER — Ambulatory Visit (INDEPENDENT_AMBULATORY_CARE_PROVIDER_SITE_OTHER): Payer: Medicare Other | Admitting: *Deleted

## 2015-10-10 DIAGNOSIS — I4891 Unspecified atrial fibrillation: Secondary | ICD-10-CM

## 2015-10-10 DIAGNOSIS — Z7901 Long term (current) use of anticoagulants: Secondary | ICD-10-CM | POA: Diagnosis not present

## 2015-10-10 DIAGNOSIS — Z5181 Encounter for therapeutic drug level monitoring: Secondary | ICD-10-CM | POA: Diagnosis not present

## 2015-10-10 LAB — POCT INR: INR: 3.9

## 2015-10-31 ENCOUNTER — Ambulatory Visit (INDEPENDENT_AMBULATORY_CARE_PROVIDER_SITE_OTHER): Payer: Medicare Other | Admitting: *Deleted

## 2015-10-31 DIAGNOSIS — Z5181 Encounter for therapeutic drug level monitoring: Secondary | ICD-10-CM

## 2015-10-31 DIAGNOSIS — Z7901 Long term (current) use of anticoagulants: Secondary | ICD-10-CM | POA: Diagnosis not present

## 2015-10-31 DIAGNOSIS — I4891 Unspecified atrial fibrillation: Secondary | ICD-10-CM

## 2015-10-31 LAB — POCT INR: INR: 2.1

## 2015-10-31 MED ORDER — WARFARIN SODIUM 2.5 MG PO TABS: ORAL_TABLET | ORAL | Status: DC

## 2015-11-07 ENCOUNTER — Other Ambulatory Visit (INDEPENDENT_AMBULATORY_CARE_PROVIDER_SITE_OTHER): Payer: Medicare Other

## 2015-11-07 DIAGNOSIS — E785 Hyperlipidemia, unspecified: Secondary | ICD-10-CM | POA: Diagnosis not present

## 2015-11-07 DIAGNOSIS — I10 Essential (primary) hypertension: Secondary | ICD-10-CM

## 2015-11-07 DIAGNOSIS — Z0000 Encounter for general adult medical examination without abnormal findings: Secondary | ICD-10-CM

## 2015-11-07 LAB — BASIC METABOLIC PANEL
BUN: 13 mg/dL (ref 6–23)
CHLORIDE: 103 meq/L (ref 96–112)
CO2: 31 mEq/L (ref 19–32)
Calcium: 9.1 mg/dL (ref 8.4–10.5)
Creatinine, Ser: 0.69 mg/dL (ref 0.40–1.20)
GFR: 87.06 mL/min (ref >60.00)
Glucose, Bld: 85 mg/dL (ref 70–99)
POTASSIUM: 3.3 meq/L — AB (ref 3.5–5.1)
Sodium: 143 mEq/L (ref 135–145)

## 2015-11-07 LAB — CBC WITH DIFFERENTIAL/PLATELET
BASOS PCT: 0.4 % (ref 0.0–3.0)
Basophils Absolute: 0 10*3/uL (ref 0.0–0.1)
Eosinophils Absolute: 0.1 10*3/uL (ref 0.0–0.7)
Eosinophils Relative: 2.3 % (ref 0.0–5.0)
HEMATOCRIT: 42.7 % (ref 36.0–46.0)
Hemoglobin: 13.9 g/dL (ref 12.0–15.0)
LYMPHS PCT: 28.9 % (ref 12.0–46.0)
Lymphs Abs: 1.7 10*3/uL (ref 0.7–4.0)
MCHC: 32.4 g/dL (ref 30.0–36.0)
MCV: 93.2 fl (ref 78.0–100.0)
MONOS PCT: 6.7 % (ref 3.0–12.0)
Monocytes Absolute: 0.4 10*3/uL (ref 0.1–1.0)
Neutro Abs: 3.6 10*3/uL (ref 1.4–7.7)
Neutrophils Relative %: 61.7 % (ref 43.0–77.0)
PLATELETS: 206 10*3/uL (ref 150.0–400.0)
RBC: 4.58 Mil/uL (ref 3.87–5.11)
RDW: 14.2 % (ref 11.5–15.5)
WBC: 5.8 10*3/uL (ref 4.0–10.5)

## 2015-11-07 LAB — LIPID PANEL
Cholesterol: 244 mg/dL — ABNORMAL HIGH (ref 0–200)
HDL: 49.8 mg/dL (ref >39.00)
LDL Cholesterol: 170 mg/dL — ABNORMAL HIGH (ref 0–99)
NONHDL: 193.86
TRIGLYCERIDES: 121 mg/dL (ref 0.0–149.0)
Total CHOL/HDL Ratio: 5
VLDL: 24.2 mg/dL (ref 0.0–40.0)

## 2015-11-07 LAB — HEPATIC FUNCTION PANEL
ALBUMIN: 3.7 g/dL (ref 3.5–5.2)
ALK PHOS: 77 U/L (ref 39–117)
ALT: 8 U/L (ref 0–35)
AST: 17 U/L (ref 0–37)
Bilirubin, Direct: 0.2 mg/dL (ref 0.0–0.3)
Total Bilirubin: 1.2 mg/dL (ref 0.2–1.2)
Total Protein: 6.4 g/dL (ref 6.0–8.3)

## 2015-11-07 LAB — TSH: TSH: 0.69 u[IU]/mL (ref 0.35–4.50)

## 2015-11-09 ENCOUNTER — Other Ambulatory Visit: Payer: Medicare Other

## 2015-11-11 ENCOUNTER — Ambulatory Visit (INDEPENDENT_AMBULATORY_CARE_PROVIDER_SITE_OTHER): Payer: Medicare Other | Admitting: Family Medicine

## 2015-11-11 ENCOUNTER — Encounter: Payer: Self-pay | Admitting: Family Medicine

## 2015-11-11 VITALS — BP 98/77 | HR 85 | Temp 98.1°F | Ht 64.0 in | Wt 151.0 lb

## 2015-11-11 DIAGNOSIS — Z23 Encounter for immunization: Secondary | ICD-10-CM

## 2015-11-11 DIAGNOSIS — Z0000 Encounter for general adult medical examination without abnormal findings: Secondary | ICD-10-CM | POA: Diagnosis not present

## 2015-11-11 LAB — POCT URINALYSIS DIPSTICK
BILIRUBIN UA: NEGATIVE
Glucose, UA: NEGATIVE
Ketones, UA: NEGATIVE
Nitrite, UA: POSITIVE
SPEC GRAV UA: 1.025
UROBILINOGEN UA: 0.2
pH, UA: 5.5

## 2015-11-11 MED ORDER — METOPROLOL TARTRATE 25 MG PO TABS
25.0000 mg | ORAL_TABLET | Freq: Two times a day (BID) | ORAL | Status: AC
Start: 2015-11-11 — End: ?

## 2015-11-11 MED ORDER — DONEPEZIL HCL 10 MG PO TABS
ORAL_TABLET | ORAL | Status: AC
Start: 2015-11-11 — End: ?

## 2015-11-11 MED ORDER — RALOXIFENE HCL 60 MG PO TABS: 60.0000 mg | ORAL_TABLET | Freq: Every day | ORAL | Status: AC

## 2015-11-11 MED ORDER — RALOXIFENE HCL 60 MG PO TABS: 60.0000 mg | ORAL_TABLET | Freq: Every day | ORAL | Status: DC

## 2015-11-11 MED ORDER — POTASSIUM CHLORIDE ER 10 MEQ PO TBCR: 20.0000 meq | EXTENDED_RELEASE_TABLET | Freq: Two times a day (BID) | ORAL | Status: AC

## 2015-11-11 MED ORDER — FUROSEMIDE 40 MG PO TABS: ORAL_TABLET | ORAL | Status: AC

## 2015-11-11 NOTE — Progress Notes (Signed)
   Subjective:    Patient ID: Angie Sullivan, female    DOB: 21-Oct-1934, 81 y.o.   MRN: [id number]  HPI 81 yr old female for a cpx. Most of the hx is obtained from her companion. She has been doing quite well and they have no concerns.    Review of Systems  Constitutional: Negative.   HENT: Negative.   Eyes: Negative.   Respiratory: Negative.   Cardiovascular: Negative.   Gastrointestinal: Negative.   Genitourinary: Negative for dysuria, urgency, frequency, hematuria, flank pain, decreased urine volume, enuresis, difficulty urinating, pelvic pain and dyspareunia.  Musculoskeletal: Negative.   Skin: Negative.   Neurological: Negative.   Psychiatric/Behavioral: Negative.        Objective:   Physical Exam  Constitutional: She is oriented to person, place, and time. She appears well-developed and well-nourished. No distress.  HENT:  Head: Normocephalic and atraumatic.  Right Ear: External ear normal.  Left Ear: External ear normal.  Nose: Nose normal.  Mouth/Throat: Oropharynx is clear and moist. No oropharyngeal exudate.  Eyes: Conjunctivae and EOM are normal. Pupils are equal, round, and reactive to light. No scleral icterus.  Neck: Normal range of motion. Neck supple. No JVD present. No thyromegaly present.  Cardiovascular: Normal rate, normal heart sounds and intact distal pulses.  Exam reveals no gallop and no friction rub.   No murmur heard. Irregular rhythm   Pulmonary/Chest: Effort normal and breath sounds normal. No respiratory distress. She has no wheezes. She has no rales. She exhibits no tenderness.  Abdominal: Soft. Bowel sounds are normal. She exhibits no distension and no mass. There is no tenderness. There is no rebound and no guarding.  Musculoskeletal: Normal range of motion. She exhibits no edema or tenderness.  Lymphadenopathy:    She has no cervical adenopathy.  Neurological: She is alert and oriented to person, place, and time. She has normal reflexes. No  cranial nerve deficit. She exhibits normal muscle tone. Coordination normal.  Skin: Skin is warm and dry. No rash noted. No erythema.  Psychiatric: She has a normal mood and affect. Her behavior is normal. Judgment and thought content normal.          Assessment & Plan:  Well exam. We discussed diet and exercise. Meds were refilled.

## 2015-11-11 NOTE — Progress Notes (Signed)
Pre visit review using our clinic review tool, if applicable. No additional management support is needed unless otherwise documented below in the visit note. 

## 2015-11-23 ENCOUNTER — Encounter: Payer: Self-pay | Admitting: *Deleted

## 2015-11-23 ENCOUNTER — Ambulatory Visit (INDEPENDENT_AMBULATORY_CARE_PROVIDER_SITE_OTHER): Payer: Medicare Other | Admitting: *Deleted

## 2015-11-23 DIAGNOSIS — Z5181 Encounter for therapeutic drug level monitoring: Secondary | ICD-10-CM

## 2015-11-23 DIAGNOSIS — Z7901 Long term (current) use of anticoagulants: Secondary | ICD-10-CM | POA: Diagnosis not present

## 2015-11-23 DIAGNOSIS — I4891 Unspecified atrial fibrillation: Secondary | ICD-10-CM

## 2015-11-23 LAB — POCT INR: INR: 2.1

## 2015-12-19 ENCOUNTER — Ambulatory Visit (INDEPENDENT_AMBULATORY_CARE_PROVIDER_SITE_OTHER): Payer: Medicare Other | Admitting: Cardiology

## 2015-12-19 ENCOUNTER — Telehealth: Payer: Self-pay | Admitting: Cardiology

## 2015-12-19 ENCOUNTER — Ambulatory Visit (INDEPENDENT_AMBULATORY_CARE_PROVIDER_SITE_OTHER): Payer: Medicare Other | Admitting: *Deleted

## 2015-12-19 ENCOUNTER — Encounter: Payer: Self-pay | Admitting: Cardiology

## 2015-12-19 VITALS — BP 108/60 | HR 100 | Wt 144.6 lb

## 2015-12-19 DIAGNOSIS — I4891 Unspecified atrial fibrillation: Secondary | ICD-10-CM | POA: Diagnosis not present

## 2015-12-19 DIAGNOSIS — I10 Essential (primary) hypertension: Secondary | ICD-10-CM | POA: Diagnosis not present

## 2015-12-19 DIAGNOSIS — Z5181 Encounter for therapeutic drug level monitoring: Secondary | ICD-10-CM

## 2015-12-19 DIAGNOSIS — Z7901 Long term (current) use of anticoagulants: Secondary | ICD-10-CM | POA: Diagnosis not present

## 2015-12-19 DIAGNOSIS — I482 Chronic atrial fibrillation, unspecified: Secondary | ICD-10-CM

## 2015-12-19 LAB — POCT INR: INR: 4.6

## 2015-12-19 NOTE — Telephone Encounter (Signed)
Erroneous encounter

## 2015-12-19 NOTE — Patient Instructions (Signed)
Your physician wants you to follow-up in: 1 year with Dr Mcdowell You will receive a reminder letter in the mail two months in advance. If you don't receive a letter, please call our office to schedule the follow-up appointment.    Your physician recommends that you continue on your current medications as directed. Please refer to the Current Medication list given to you today.     If you need a refill on your cardiac medications before your next appointment, please call your pharmacy.      Thank you for choosing [facility] !        

## 2015-12-19 NOTE — Progress Notes (Signed)
Cardiology Office Note  Date: 12/19/2015   ID: Angie Sullivan, DOB [birth year], MRN [id number]  PCP: Nelwyn Salisbury, MD  Evaluating Cardiologist: Nona Dell, MD   Chief Complaint  Patient presents with  . Atrial Fibrillation    History of Present Illness: Angie Sullivan is a 82 y.o. female that I am meeting for the first time in clinic today. She is a former patient Dr. Daleen Squibb, most recently seen by Ms. Lawrence NP in October 2014. I reviewed her records and updated chart. She is here today with a family friend. I reviewed her cardiac history which includes chronic atrial fibrillation. She also has dementia, does not provide a lot of historical detail, but states that she is not bothered by any sense of palpitations or chest pain. She gets assistance with setting out her medications.  She is on Coumadin, followed in the anticoagulation clinic.She has had some falls, but no injuries or bleeding problems reported. ECG today shows atrial fibrillation around 100 bpm with low voltage and nonspecific T-wave changes.  She follows regularly with Dr. Clent Ridges for primary care.  Past Medical History  Diagnosis Date  . Essential hypertension   . Hyperlipidemia   . Osteoporosis   . Chronic atrial fibrillation (HCC)   . Diverticulosis   . Dementia   . Internal hemorrhoids   . History of colonic polyps     Current Outpatient Prescriptions  Medication Sig Dispense Refill  . donepezil (ARICEPT) 10 MG tablet TAKE ONE TABLET BY MOUTH AT BEDTIME AS NEEDED 90 tablet 3  . furosemide (LASIX) 40 MG tablet TAKE TWO TABLETS BY MOUTH IN THE MORNING AND ONE IN THE EVENING 270 tablet 3  . metoprolol tartrate (LOPRESSOR) 25 MG tablet Take 1 tablet (25 mg total) by mouth 2 (two) times daily. 180 tablet 3  . potassium chloride (KLOR-CON 10) 10 MEQ tablet Take 2 tablets (20 mEq total) by mouth 2 (two) times daily. 360 tablet 3  . raloxifene (EVISTA) 60 MG tablet Take 1 tablet (60 mg total) by mouth daily. 90  tablet 3  . warfarin (COUMADIN) 1 MG tablet Take 1 ( ) tablet daily in addition to the 2.5mg  tablet 30 tablet 3  . warfarin (COUMADIN) 2.5 MG tablet Take 1 tablet daily except 2 tablets on Mondays (along with  tablet daily = 3.5mg  daily except  on Mondays) 45 tablet 3   No current facility-administered medications for this visit.   Allergies:  Review of patient's allergies indicates no known allergies.   Social History: The patient  reports that she has never smoked. She has never used smokeless tobacco. She reports that she does not drink alcohol or use illicit drugs.   ROS:  Please see the history of present illness. Otherwise, complete review of systems is positive for memory deficits.  All other systems are reviewed and negative.   Physical Exam: VS:  BP 108/60 mmHg  Pulse 100  Wt 144 lb 9.6 oz (65.59 kg)  SpO2 95%, BMI Body mass index is 24.81 kg/(m^2).  Wt Readings from Last 3 Encounters:  12/19/15 144 lb 9.6 oz (65.59 kg)  11/11/15 151 lb (68.493 kg)  12/13/14 146 lb (66.225 kg)    General: Elderly woman, no distress. HEENT: Conjunctiva and lids normal, oropharynx clear. Neck: Supple, no elevated JVP or carotid bruits, no thyromegaly. Lungs: Clear to auscultation, nonlabored breathing at rest. Cardiac: Irregularly irregular, no S3, soft systolic murmur, no pericardial rub. Abdomen: Soft, nontender, bowel sounds present. Extremities: No pitting  edema, distal pulses 2+. Skin: Warm and dry.  ECG: ECG is ordered today.  Recent Labwork: 11/07/2015: ALT 8; AST 17; BUN 13; Creatinine, Ser 0.69; Hemoglobin 13.9; Platelets 206.0; Potassium 3.3*; Sodium 143; TSH 0.69   Assessment and Plan:  1. Chronic atrial fibrillation. No significant symptomatology in terms of palpitations or unusual breathlessness. ECG reviewed. Plan to continue current regimen including Lopressor and Coumadin with follow-up in the anticoagulation clinic.   2. Essential hypertension by history, blood  pressure low normal today. Keep follow-up with Dr. Clent Ridges.  Current medicines were reviewed with the patient today.   Orders Placed This Encounter  Procedures  . EKG 12-Lead    Disposition: FU with me in 1 year.   Signed, Jonelle Sidle, MD, [facility] 12/19/2015 2:34 PM    [facility] at [facility] [address], [place], [place] [address] Phone: [phone]; Fax: [phone]

## 2015-12-22 ENCOUNTER — Ambulatory Visit (INDEPENDENT_AMBULATORY_CARE_PROVIDER_SITE_OTHER): Payer: Medicare Other | Admitting: *Deleted

## 2015-12-22 DIAGNOSIS — I4891 Unspecified atrial fibrillation: Secondary | ICD-10-CM | POA: Diagnosis not present

## 2015-12-22 DIAGNOSIS — Z7901 Long term (current) use of anticoagulants: Secondary | ICD-10-CM

## 2015-12-22 DIAGNOSIS — Z5181 Encounter for therapeutic drug level monitoring: Secondary | ICD-10-CM | POA: Diagnosis not present

## 2015-12-22 LAB — POCT INR: INR: 3.3

## 2015-12-22 MED ORDER — WARFARIN SODIUM 2.5 MG PO TABS: ORAL_TABLET | ORAL | Status: AC

## 2015-12-22 MED ORDER — WARFARIN SODIUM 1 MG PO TABS: ORAL_TABLET | ORAL | Status: AC

## 2016-01-13 DIAGNOSIS — R319 Hematuria, unspecified: Secondary | ICD-10-CM | POA: Diagnosis not present

## 2016-01-13 DIAGNOSIS — N390 Urinary tract infection, site not specified: Secondary | ICD-10-CM | POA: Diagnosis not present

## 2016-01-16 ENCOUNTER — Ambulatory Visit (INDEPENDENT_AMBULATORY_CARE_PROVIDER_SITE_OTHER): Payer: Medicare Other | Admitting: *Deleted

## 2016-01-16 DIAGNOSIS — Z7901 Long term (current) use of anticoagulants: Secondary | ICD-10-CM | POA: Diagnosis not present

## 2016-01-16 DIAGNOSIS — I4891 Unspecified atrial fibrillation: Secondary | ICD-10-CM | POA: Diagnosis not present

## 2016-01-16 DIAGNOSIS — Z5181 Encounter for therapeutic drug level monitoring: Secondary | ICD-10-CM | POA: Diagnosis not present

## 2016-01-16 LAB — POCT INR: INR: 6.2

## 2016-01-23 ENCOUNTER — Ambulatory Visit (INDEPENDENT_AMBULATORY_CARE_PROVIDER_SITE_OTHER): Payer: Medicare Other | Admitting: *Deleted

## 2016-01-23 DIAGNOSIS — I4891 Unspecified atrial fibrillation: Secondary | ICD-10-CM | POA: Diagnosis not present

## 2016-01-23 DIAGNOSIS — Z7901 Long term (current) use of anticoagulants: Secondary | ICD-10-CM

## 2016-01-23 DIAGNOSIS — Z5181 Encounter for therapeutic drug level monitoring: Secondary | ICD-10-CM | POA: Diagnosis not present

## 2016-01-23 LAB — POCT INR: INR: 1.7

## 2016-02-13 ENCOUNTER — Ambulatory Visit (INDEPENDENT_AMBULATORY_CARE_PROVIDER_SITE_OTHER): Payer: Medicare Other | Admitting: *Deleted

## 2016-02-13 DIAGNOSIS — I4891 Unspecified atrial fibrillation: Secondary | ICD-10-CM

## 2016-02-13 DIAGNOSIS — Z5181 Encounter for therapeutic drug level monitoring: Secondary | ICD-10-CM | POA: Diagnosis not present

## 2016-02-13 DIAGNOSIS — Z7901 Long term (current) use of anticoagulants: Secondary | ICD-10-CM | POA: Diagnosis not present

## 2016-02-13 LAB — POCT INR: INR: 3.8

## 2016-02-22 ENCOUNTER — Ambulatory Visit (INDEPENDENT_AMBULATORY_CARE_PROVIDER_SITE_OTHER): Payer: Medicare Other | Admitting: *Deleted

## 2016-02-22 DIAGNOSIS — I4891 Unspecified atrial fibrillation: Secondary | ICD-10-CM

## 2016-02-22 DIAGNOSIS — Z5181 Encounter for therapeutic drug level monitoring: Secondary | ICD-10-CM | POA: Diagnosis not present

## 2016-02-22 DIAGNOSIS — Z7901 Long term (current) use of anticoagulants: Secondary | ICD-10-CM

## 2016-02-22 LAB — POCT INR: INR: 3

## 2016-04-04 ENCOUNTER — Ambulatory Visit (INDEPENDENT_AMBULATORY_CARE_PROVIDER_SITE_OTHER): Payer: Medicare Other | Admitting: *Deleted

## 2016-04-04 DIAGNOSIS — Z5181 Encounter for therapeutic drug level monitoring: Secondary | ICD-10-CM

## 2016-04-04 DIAGNOSIS — I4891 Unspecified atrial fibrillation: Secondary | ICD-10-CM | POA: Diagnosis not present

## 2016-04-04 LAB — POCT INR: INR: 4.1

## 2016-04-24 DIAGNOSIS — Z5181 Encounter for therapeutic drug level monitoring: Secondary | ICD-10-CM | POA: Diagnosis not present

## 2016-04-24 DIAGNOSIS — I4891 Unspecified atrial fibrillation: Secondary | ICD-10-CM | POA: Diagnosis not present

## 2016-04-24 LAB — PROTIME-INR: INR: 3.1 — AB (ref 0.9–1.1)

## 2016-04-25 ENCOUNTER — Ambulatory Visit (INDEPENDENT_AMBULATORY_CARE_PROVIDER_SITE_OTHER): Payer: Medicare Other | Admitting: *Deleted

## 2016-04-25 DIAGNOSIS — I4891 Unspecified atrial fibrillation: Secondary | ICD-10-CM

## 2016-04-25 DIAGNOSIS — Z5181 Encounter for therapeutic drug level monitoring: Secondary | ICD-10-CM

## 2016-04-25 LAB — PROTIME-INR
INR: 3.1 — AB (ref 0.8–1.2)
PROTHROMBIN TIME: 31.6 s — AB (ref 9.1–12.0)

## 2016-05-24 ENCOUNTER — Ambulatory Visit (INDEPENDENT_AMBULATORY_CARE_PROVIDER_SITE_OTHER): Payer: Medicare Other | Admitting: *Deleted

## 2016-05-24 DIAGNOSIS — I4891 Unspecified atrial fibrillation: Secondary | ICD-10-CM

## 2016-05-24 DIAGNOSIS — Z5181 Encounter for therapeutic drug level monitoring: Secondary | ICD-10-CM

## 2016-05-24 LAB — POCT INR: INR: 3.2

## 2016-06-28 ENCOUNTER — Telehealth: Payer: Self-pay | Admitting: Family Medicine

## 2016-06-28 NOTE — Telephone Encounter (Signed)
°  Pt daughter call to say she need to have a FL2 form filled out. She is asking if her Mom need an appt or if she can mail the form  [phone]

## 2016-06-28 NOTE — Telephone Encounter (Signed)
She can mail the form or drop it off, no appt is needed

## 2016-07-04 ENCOUNTER — Other Ambulatory Visit: Payer: Self-pay | Admitting: Family Medicine

## 2016-07-04 DIAGNOSIS — Z1231 Encounter for screening mammogram for malignant neoplasm of breast: Secondary | ICD-10-CM

## 2016-07-05 ENCOUNTER — Ambulatory Visit (HOSPITAL_COMMUNITY)
Admission: RE | Admit: 2016-07-05 | Discharge: 2016-07-05 | Disposition: A | Discharge status: Home / Self Care | Payer: Medicare Other | Source: Ambulatory Visit | Attending: Family Medicine | Admitting: Family Medicine

## 2016-07-05 ENCOUNTER — Ambulatory Visit (INDEPENDENT_AMBULATORY_CARE_PROVIDER_SITE_OTHER): Payer: Medicare Other | Admitting: *Deleted

## 2016-07-05 DIAGNOSIS — Z1231 Encounter for screening mammogram for malignant neoplasm of breast: Secondary | ICD-10-CM | POA: Insufficient documentation

## 2016-07-05 DIAGNOSIS — I4891 Unspecified atrial fibrillation: Secondary | ICD-10-CM

## 2016-07-05 DIAGNOSIS — Z5181 Encounter for therapeutic drug level monitoring: Secondary | ICD-10-CM

## 2016-07-05 LAB — POCT INR: INR: 2.1

## 2016-07-27 DIAGNOSIS — Z7689 Persons encountering health services in other specified circumstances: Secondary | ICD-10-CM

## 2016-08-10 ENCOUNTER — Telehealth: Payer: Self-pay | Admitting: Family Medicine

## 2016-08-10 NOTE — Telephone Encounter (Signed)
On the pts FL-2 form where it has bladder and bowel needs to be corrected to incontinent instead continent because the pt wears depends.

## 2016-08-13 NOTE — Telephone Encounter (Signed)
I spoke with Lyn and she requested to mail to [address] [place], [place] [id number], which I did.

## 2016-08-13 NOTE — Telephone Encounter (Signed)
Dr. Clent RidgesFry did make a change to form and I faxed to below number.

## 2016-08-27 DIAGNOSIS — G301 Alzheimer's disease with late onset: Secondary | ICD-10-CM | POA: Diagnosis not present

## 2016-08-27 DIAGNOSIS — Z7901 Long term (current) use of anticoagulants: Secondary | ICD-10-CM | POA: Diagnosis not present

## 2016-08-27 DIAGNOSIS — I10 Essential (primary) hypertension: Secondary | ICD-10-CM | POA: Diagnosis not present

## 2016-08-27 DIAGNOSIS — Z23 Encounter for immunization: Secondary | ICD-10-CM | POA: Diagnosis not present

## 2016-09-24 DIAGNOSIS — I482 Chronic atrial fibrillation: Secondary | ICD-10-CM | POA: Diagnosis not present

## 2016-09-24 DIAGNOSIS — Z7901 Long term (current) use of anticoagulants: Secondary | ICD-10-CM | POA: Diagnosis not present

## 2016-09-24 DIAGNOSIS — S90821A Blister (nonthermal), right foot, initial encounter: Secondary | ICD-10-CM | POA: Diagnosis not present

## 2016-09-24 DIAGNOSIS — R8290 Unspecified abnormal findings in urine: Secondary | ICD-10-CM | POA: Diagnosis not present

## 2016-09-24 DIAGNOSIS — G301 Alzheimer's disease with late onset: Secondary | ICD-10-CM | POA: Diagnosis not present

## 2016-10-03 DIAGNOSIS — H1032 Unspecified acute conjunctivitis, left eye: Secondary | ICD-10-CM | POA: Diagnosis not present

## 2016-10-11 DIAGNOSIS — R21 Rash and other nonspecific skin eruption: Secondary | ICD-10-CM | POA: Diagnosis not present

## 2016-10-11 DIAGNOSIS — J22 Unspecified acute lower respiratory infection: Secondary | ICD-10-CM | POA: Diagnosis not present

## 2016-10-11 DIAGNOSIS — Z7901 Long term (current) use of anticoagulants: Secondary | ICD-10-CM | POA: Diagnosis not present

## 2016-10-29 DIAGNOSIS — I10 Essential (primary) hypertension: Secondary | ICD-10-CM | POA: Diagnosis not present

## 2016-10-29 DIAGNOSIS — R600 Localized edema: Secondary | ICD-10-CM | POA: Diagnosis not present

## 2016-10-29 DIAGNOSIS — I482 Chronic atrial fibrillation: Secondary | ICD-10-CM | POA: Diagnosis not present

## 2016-10-29 DIAGNOSIS — G301 Alzheimer's disease with late onset: Secondary | ICD-10-CM | POA: Diagnosis not present

## 2016-10-29 DIAGNOSIS — Z7901 Long term (current) use of anticoagulants: Secondary | ICD-10-CM | POA: Diagnosis not present

## 2016-11-08 DIAGNOSIS — G301 Alzheimer's disease with late onset: Secondary | ICD-10-CM | POA: Diagnosis not present

## 2016-11-08 DIAGNOSIS — Z7901 Long term (current) use of anticoagulants: Secondary | ICD-10-CM | POA: Diagnosis not present

## 2016-11-08 DIAGNOSIS — N950 Postmenopausal bleeding: Secondary | ICD-10-CM | POA: Diagnosis not present

## 2016-11-12 DIAGNOSIS — N950 Postmenopausal bleeding: Secondary | ICD-10-CM | POA: Diagnosis not present

## 2016-11-22 DIAGNOSIS — I482 Chronic atrial fibrillation: Secondary | ICD-10-CM | POA: Diagnosis not present

## 2016-11-28 DIAGNOSIS — I482 Chronic atrial fibrillation: Secondary | ICD-10-CM | POA: Diagnosis not present

## 2016-11-28 DIAGNOSIS — Z7901 Long term (current) use of anticoagulants: Secondary | ICD-10-CM | POA: Diagnosis not present

## 2016-11-28 DIAGNOSIS — A419 Sepsis, unspecified organism: Secondary | ICD-10-CM | POA: Diagnosis not present

## 2016-11-28 DIAGNOSIS — N390 Urinary tract infection, site not specified: Secondary | ICD-10-CM | POA: Diagnosis not present

## 2016-11-28 DIAGNOSIS — R404 Transient alteration of awareness: Secondary | ICD-10-CM | POA: Diagnosis not present

## 2016-11-28 DIAGNOSIS — I517 Cardiomegaly: Secondary | ICD-10-CM | POA: Diagnosis not present

## 2016-11-28 DIAGNOSIS — E876 Hypokalemia: Secondary | ICD-10-CM | POA: Diagnosis not present

## 2016-11-28 DIAGNOSIS — E870 Hyperosmolality and hypernatremia: Secondary | ICD-10-CM | POA: Diagnosis not present

## 2016-11-28 DIAGNOSIS — I4510 Unspecified right bundle-branch block: Secondary | ICD-10-CM | POA: Diagnosis not present

## 2016-11-28 DIAGNOSIS — Z79899 Other long term (current) drug therapy: Secondary | ICD-10-CM | POA: Diagnosis not present

## 2016-11-28 DIAGNOSIS — R531 Weakness: Secondary | ICD-10-CM | POA: Diagnosis not present

## 2016-11-28 DIAGNOSIS — I481 Persistent atrial fibrillation: Secondary | ICD-10-CM | POA: Diagnosis not present

## 2016-11-28 DIAGNOSIS — J189 Pneumonia, unspecified organism: Secondary | ICD-10-CM | POA: Diagnosis not present

## 2016-11-28 DIAGNOSIS — R739 Hyperglycemia, unspecified: Secondary | ICD-10-CM | POA: Diagnosis not present

## 2016-11-28 DIAGNOSIS — R55 Syncope and collapse: Secondary | ICD-10-CM | POA: Diagnosis not present

## 2016-11-28 DIAGNOSIS — R1312 Dysphagia, oropharyngeal phase: Secondary | ICD-10-CM | POA: Diagnosis not present

## 2016-11-28 DIAGNOSIS — D72829 Elevated white blood cell count, unspecified: Secondary | ICD-10-CM | POA: Diagnosis not present

## 2016-11-29 DIAGNOSIS — G301 Alzheimer's disease with late onset: Secondary | ICD-10-CM | POA: Diagnosis not present

## 2016-11-29 DIAGNOSIS — I482 Chronic atrial fibrillation: Secondary | ICD-10-CM | POA: Diagnosis not present

## 2016-11-29 DIAGNOSIS — J189 Pneumonia, unspecified organism: Secondary | ICD-10-CM | POA: Diagnosis not present

## 2016-11-29 DIAGNOSIS — R0602 Shortness of breath: Secondary | ICD-10-CM | POA: Diagnosis not present

## 2016-11-29 DIAGNOSIS — Z7901 Long term (current) use of anticoagulants: Secondary | ICD-10-CM | POA: Diagnosis not present

## 2016-11-29 DIAGNOSIS — N3000 Acute cystitis without hematuria: Secondary | ICD-10-CM | POA: Diagnosis not present

## 2016-11-30 DIAGNOSIS — G301 Alzheimer's disease with late onset: Secondary | ICD-10-CM | POA: Diagnosis not present

## 2016-11-30 DIAGNOSIS — I4891 Unspecified atrial fibrillation: Secondary | ICD-10-CM | POA: Diagnosis not present

## 2016-11-30 DIAGNOSIS — N3000 Acute cystitis without hematuria: Secondary | ICD-10-CM | POA: Diagnosis not present

## 2016-11-30 DIAGNOSIS — Z7901 Long term (current) use of anticoagulants: Secondary | ICD-10-CM | POA: Diagnosis not present

## 2016-11-30 DIAGNOSIS — J189 Pneumonia, unspecified organism: Secondary | ICD-10-CM | POA: Diagnosis not present

## 2016-12-01 DIAGNOSIS — G301 Alzheimer's disease with late onset: Secondary | ICD-10-CM | POA: Diagnosis not present

## 2016-12-01 DIAGNOSIS — R5381 Other malaise: Secondary | ICD-10-CM | POA: Diagnosis not present

## 2016-12-01 DIAGNOSIS — R1312 Dysphagia, oropharyngeal phase: Secondary | ICD-10-CM | POA: Diagnosis not present

## 2016-12-01 DIAGNOSIS — N3000 Acute cystitis without hematuria: Secondary | ICD-10-CM | POA: Diagnosis not present

## 2016-12-01 DIAGNOSIS — M810 Age-related osteoporosis without current pathological fracture: Secondary | ICD-10-CM | POA: Diagnosis not present

## 2016-12-01 DIAGNOSIS — E876 Hypokalemia: Secondary | ICD-10-CM | POA: Diagnosis not present

## 2016-12-01 DIAGNOSIS — M6281 Muscle weakness (generalized): Secondary | ICD-10-CM | POA: Diagnosis not present

## 2016-12-01 DIAGNOSIS — I509 Heart failure, unspecified: Secondary | ICD-10-CM | POA: Diagnosis not present

## 2016-12-01 DIAGNOSIS — N390 Urinary tract infection, site not specified: Secondary | ICD-10-CM | POA: Diagnosis not present

## 2016-12-01 DIAGNOSIS — R55 Syncope and collapse: Secondary | ICD-10-CM | POA: Diagnosis not present

## 2016-12-01 DIAGNOSIS — R488 Other symbolic dysfunctions: Secondary | ICD-10-CM | POA: Diagnosis not present

## 2016-12-01 DIAGNOSIS — R739 Hyperglycemia, unspecified: Secondary | ICD-10-CM | POA: Diagnosis not present

## 2016-12-01 DIAGNOSIS — Z79899 Other long term (current) drug therapy: Secondary | ICD-10-CM | POA: Diagnosis not present

## 2016-12-01 DIAGNOSIS — I4510 Unspecified right bundle-branch block: Secondary | ICD-10-CM | POA: Diagnosis not present

## 2016-12-01 DIAGNOSIS — E870 Hyperosmolality and hypernatremia: Secondary | ICD-10-CM | POA: Diagnosis not present

## 2016-12-01 DIAGNOSIS — D72829 Elevated white blood cell count, unspecified: Secondary | ICD-10-CM | POA: Diagnosis not present

## 2016-12-01 DIAGNOSIS — A419 Sepsis, unspecified organism: Secondary | ICD-10-CM | POA: Diagnosis not present

## 2016-12-01 DIAGNOSIS — R531 Weakness: Secondary | ICD-10-CM | POA: Diagnosis not present

## 2016-12-01 DIAGNOSIS — I517 Cardiomegaly: Secondary | ICD-10-CM | POA: Diagnosis not present

## 2016-12-01 DIAGNOSIS — R262 Difficulty in walking, not elsewhere classified: Secondary | ICD-10-CM | POA: Diagnosis not present

## 2016-12-01 DIAGNOSIS — I4891 Unspecified atrial fibrillation: Secondary | ICD-10-CM | POA: Diagnosis not present

## 2016-12-01 DIAGNOSIS — J189 Pneumonia, unspecified organism: Secondary | ICD-10-CM | POA: Diagnosis not present

## 2016-12-01 DIAGNOSIS — I482 Chronic atrial fibrillation: Secondary | ICD-10-CM | POA: Diagnosis not present

## 2016-12-01 DIAGNOSIS — Z7901 Long term (current) use of anticoagulants: Secondary | ICD-10-CM | POA: Diagnosis not present

## 2016-12-01 DIAGNOSIS — I10 Essential (primary) hypertension: Secondary | ICD-10-CM | POA: Diagnosis not present

## 2016-12-01 DIAGNOSIS — I481 Persistent atrial fibrillation: Secondary | ICD-10-CM | POA: Diagnosis not present

## 2016-12-03 DIAGNOSIS — R55 Syncope and collapse: Secondary | ICD-10-CM | POA: Diagnosis not present

## 2016-12-03 DIAGNOSIS — R5381 Other malaise: Secondary | ICD-10-CM | POA: Diagnosis not present

## 2016-12-03 DIAGNOSIS — J189 Pneumonia, unspecified organism: Secondary | ICD-10-CM | POA: Diagnosis not present

## 2016-12-05 DIAGNOSIS — N390 Urinary tract infection, site not specified: Secondary | ICD-10-CM | POA: Diagnosis not present

## 2016-12-05 DIAGNOSIS — R5381 Other malaise: Secondary | ICD-10-CM | POA: Diagnosis not present

## 2016-12-05 DIAGNOSIS — I509 Heart failure, unspecified: Secondary | ICD-10-CM | POA: Diagnosis not present

## 2016-12-05 DIAGNOSIS — J189 Pneumonia, unspecified organism: Secondary | ICD-10-CM | POA: Diagnosis not present

## 2016-12-11 DIAGNOSIS — N390 Urinary tract infection, site not specified: Secondary | ICD-10-CM | POA: Diagnosis not present

## 2016-12-11 DIAGNOSIS — I509 Heart failure, unspecified: Secondary | ICD-10-CM | POA: Diagnosis not present

## 2016-12-11 DIAGNOSIS — I4891 Unspecified atrial fibrillation: Secondary | ICD-10-CM | POA: Diagnosis not present

## 2016-12-11 DIAGNOSIS — J189 Pneumonia, unspecified organism: Secondary | ICD-10-CM | POA: Diagnosis not present

## 2016-12-18 DIAGNOSIS — I4891 Unspecified atrial fibrillation: Secondary | ICD-10-CM | POA: Diagnosis not present

## 2016-12-18 DIAGNOSIS — N390 Urinary tract infection, site not specified: Secondary | ICD-10-CM | POA: Diagnosis not present

## 2016-12-18 DIAGNOSIS — J189 Pneumonia, unspecified organism: Secondary | ICD-10-CM | POA: Diagnosis not present

## 2016-12-18 DIAGNOSIS — I10 Essential (primary) hypertension: Secondary | ICD-10-CM | POA: Diagnosis not present

## 2016-12-27 DIAGNOSIS — I4891 Unspecified atrial fibrillation: Secondary | ICD-10-CM | POA: Diagnosis not present

## 2016-12-27 DIAGNOSIS — R05 Cough: Secondary | ICD-10-CM | POA: Diagnosis not present

## 2016-12-27 DIAGNOSIS — I509 Heart failure, unspecified: Secondary | ICD-10-CM | POA: Diagnosis not present

## 2016-12-28 DIAGNOSIS — I200 Unstable angina: Secondary | ICD-10-CM | POA: Diagnosis not present

## 2016-12-28 DIAGNOSIS — Z79899 Other long term (current) drug therapy: Secondary | ICD-10-CM | POA: Diagnosis not present

## 2016-12-31 DIAGNOSIS — I509 Heart failure, unspecified: Secondary | ICD-10-CM | POA: Diagnosis not present

## 2016-12-31 DIAGNOSIS — I4891 Unspecified atrial fibrillation: Secondary | ICD-10-CM | POA: Diagnosis not present

## 2016-12-31 DIAGNOSIS — J189 Pneumonia, unspecified organism: Secondary | ICD-10-CM | POA: Diagnosis not present

## 2017-01-03 DIAGNOSIS — J189 Pneumonia, unspecified organism: Secondary | ICD-10-CM | POA: Diagnosis not present

## 2017-01-03 DIAGNOSIS — I509 Heart failure, unspecified: Secondary | ICD-10-CM | POA: Diagnosis not present

## 2017-01-07 DIAGNOSIS — J189 Pneumonia, unspecified organism: Secondary | ICD-10-CM | POA: Diagnosis not present

## 2017-01-07 DIAGNOSIS — M810 Age-related osteoporosis without current pathological fracture: Secondary | ICD-10-CM | POA: Diagnosis not present

## 2017-01-07 DIAGNOSIS — I509 Heart failure, unspecified: Secondary | ICD-10-CM | POA: Diagnosis not present

## 2017-01-14 DIAGNOSIS — J189 Pneumonia, unspecified organism: Secondary | ICD-10-CM | POA: Diagnosis not present

## 2017-01-14 DIAGNOSIS — I509 Heart failure, unspecified: Secondary | ICD-10-CM | POA: Diagnosis not present

## 2017-01-14 DIAGNOSIS — I4891 Unspecified atrial fibrillation: Secondary | ICD-10-CM | POA: Diagnosis not present

## 2017-01-22 DIAGNOSIS — I509 Heart failure, unspecified: Secondary | ICD-10-CM | POA: Diagnosis not present

## 2017-01-22 DIAGNOSIS — I4891 Unspecified atrial fibrillation: Secondary | ICD-10-CM | POA: Diagnosis not present

## 2017-01-22 DIAGNOSIS — K5900 Constipation, unspecified: Secondary | ICD-10-CM | POA: Diagnosis not present

## 2017-01-28 DIAGNOSIS — R791 Abnormal coagulation profile: Secondary | ICD-10-CM | POA: Diagnosis not present

## 2017-01-28 DIAGNOSIS — I509 Heart failure, unspecified: Secondary | ICD-10-CM | POA: Diagnosis not present

## 2017-01-28 DIAGNOSIS — I4891 Unspecified atrial fibrillation: Secondary | ICD-10-CM | POA: Diagnosis not present

## 2017-01-30 DIAGNOSIS — Z7901 Long term (current) use of anticoagulants: Secondary | ICD-10-CM | POA: Diagnosis not present

## 2017-01-30 DIAGNOSIS — M6281 Muscle weakness (generalized): Secondary | ICD-10-CM | POA: Diagnosis not present

## 2017-01-30 DIAGNOSIS — I4891 Unspecified atrial fibrillation: Secondary | ICD-10-CM | POA: Diagnosis not present

## 2017-01-30 DIAGNOSIS — I509 Heart failure, unspecified: Secondary | ICD-10-CM | POA: Diagnosis not present

## 2017-01-31 DIAGNOSIS — M6281 Muscle weakness (generalized): Secondary | ICD-10-CM | POA: Diagnosis not present

## 2017-02-01 DIAGNOSIS — M6281 Muscle weakness (generalized): Secondary | ICD-10-CM | POA: Diagnosis not present

## 2017-02-04 DIAGNOSIS — M6281 Muscle weakness (generalized): Secondary | ICD-10-CM | POA: Diagnosis not present

## 2017-02-04 DIAGNOSIS — Z79899 Other long term (current) drug therapy: Secondary | ICD-10-CM | POA: Diagnosis not present

## 2017-02-05 DIAGNOSIS — E538 Deficiency of other specified B group vitamins: Secondary | ICD-10-CM | POA: Diagnosis not present

## 2017-02-05 DIAGNOSIS — D649 Anemia, unspecified: Secondary | ICD-10-CM | POA: Diagnosis not present

## 2017-02-05 DIAGNOSIS — M6281 Muscle weakness (generalized): Secondary | ICD-10-CM | POA: Diagnosis not present

## 2017-02-06 DIAGNOSIS — M6281 Muscle weakness (generalized): Secondary | ICD-10-CM | POA: Diagnosis not present

## 2017-02-07 DIAGNOSIS — M6281 Muscle weakness (generalized): Secondary | ICD-10-CM | POA: Diagnosis not present

## 2017-02-08 DIAGNOSIS — M6281 Muscle weakness (generalized): Secondary | ICD-10-CM | POA: Diagnosis not present

## 2017-02-11 DIAGNOSIS — M6281 Muscle weakness (generalized): Secondary | ICD-10-CM | POA: Diagnosis not present

## 2017-02-12 DIAGNOSIS — D509 Iron deficiency anemia, unspecified: Secondary | ICD-10-CM | POA: Diagnosis not present

## 2017-02-12 DIAGNOSIS — M6281 Muscle weakness (generalized): Secondary | ICD-10-CM | POA: Diagnosis not present

## 2017-02-13 DIAGNOSIS — M6281 Muscle weakness (generalized): Secondary | ICD-10-CM | POA: Diagnosis not present

## 2017-02-14 DIAGNOSIS — M6281 Muscle weakness (generalized): Secondary | ICD-10-CM | POA: Diagnosis not present

## 2017-02-15 DIAGNOSIS — M6281 Muscle weakness (generalized): Secondary | ICD-10-CM | POA: Diagnosis not present

## 2017-02-18 DIAGNOSIS — M6281 Muscle weakness (generalized): Secondary | ICD-10-CM | POA: Diagnosis not present

## 2017-02-19 DIAGNOSIS — M6281 Muscle weakness (generalized): Secondary | ICD-10-CM | POA: Diagnosis not present

## 2017-02-22 DIAGNOSIS — M6281 Muscle weakness (generalized): Secondary | ICD-10-CM | POA: Diagnosis not present

## 2017-02-25 DIAGNOSIS — M6281 Muscle weakness (generalized): Secondary | ICD-10-CM | POA: Diagnosis not present

## 2017-02-26 DIAGNOSIS — M6281 Muscle weakness (generalized): Secondary | ICD-10-CM | POA: Diagnosis not present

## 2017-02-27 DIAGNOSIS — M6281 Muscle weakness (generalized): Secondary | ICD-10-CM | POA: Diagnosis not present

## 2017-02-28 DIAGNOSIS — M6281 Muscle weakness (generalized): Secondary | ICD-10-CM | POA: Diagnosis not present

## 2017-03-01 DIAGNOSIS — M6281 Muscle weakness (generalized): Secondary | ICD-10-CM | POA: Diagnosis not present

## 2017-03-04 DIAGNOSIS — I10 Essential (primary) hypertension: Secondary | ICD-10-CM | POA: Diagnosis not present

## 2017-03-04 DIAGNOSIS — I4891 Unspecified atrial fibrillation: Secondary | ICD-10-CM | POA: Diagnosis not present

## 2017-03-04 DIAGNOSIS — I509 Heart failure, unspecified: Secondary | ICD-10-CM | POA: Diagnosis not present

## 2017-03-05 DIAGNOSIS — M6281 Muscle weakness (generalized): Secondary | ICD-10-CM | POA: Diagnosis not present

## 2017-03-11 DIAGNOSIS — B379 Candidiasis, unspecified: Secondary | ICD-10-CM | POA: Diagnosis not present

## 2017-03-11 DIAGNOSIS — L309 Dermatitis, unspecified: Secondary | ICD-10-CM | POA: Diagnosis not present

## 2017-03-12 DIAGNOSIS — D509 Iron deficiency anemia, unspecified: Secondary | ICD-10-CM | POA: Diagnosis not present

## 2017-05-01 DIAGNOSIS — I509 Heart failure, unspecified: Secondary | ICD-10-CM | POA: Diagnosis not present

## 2017-05-01 DIAGNOSIS — I4891 Unspecified atrial fibrillation: Secondary | ICD-10-CM | POA: Diagnosis not present

## 2017-05-01 DIAGNOSIS — J449 Chronic obstructive pulmonary disease, unspecified: Secondary | ICD-10-CM | POA: Diagnosis not present

## 2017-05-02 DIAGNOSIS — I10 Essential (primary) hypertension: Secondary | ICD-10-CM | POA: Diagnosis not present

## 2017-05-02 DIAGNOSIS — D649 Anemia, unspecified: Secondary | ICD-10-CM | POA: Diagnosis not present

## 2017-05-06 DIAGNOSIS — I4891 Unspecified atrial fibrillation: Secondary | ICD-10-CM | POA: Diagnosis not present

## 2017-05-06 DIAGNOSIS — Z7901 Long term (current) use of anticoagulants: Secondary | ICD-10-CM | POA: Diagnosis not present

## 2017-05-06 DIAGNOSIS — Z87898 Personal history of other specified conditions: Secondary | ICD-10-CM | POA: Diagnosis not present

## 2017-05-07 DIAGNOSIS — D649 Anemia, unspecified: Secondary | ICD-10-CM | POA: Diagnosis not present

## 2017-05-07 DIAGNOSIS — D508 Other iron deficiency anemias: Secondary | ICD-10-CM | POA: Diagnosis not present

## 2017-05-13 DIAGNOSIS — Z87898 Personal history of other specified conditions: Secondary | ICD-10-CM | POA: Diagnosis not present

## 2017-05-13 DIAGNOSIS — I509 Heart failure, unspecified: Secondary | ICD-10-CM | POA: Diagnosis not present

## 2017-05-13 DIAGNOSIS — I4891 Unspecified atrial fibrillation: Secondary | ICD-10-CM | POA: Diagnosis not present

## 2017-05-21 DIAGNOSIS — J449 Chronic obstructive pulmonary disease, unspecified: Secondary | ICD-10-CM | POA: Diagnosis not present

## 2017-05-21 DIAGNOSIS — I4891 Unspecified atrial fibrillation: Secondary | ICD-10-CM | POA: Diagnosis not present

## 2017-05-21 DIAGNOSIS — M6281 Muscle weakness (generalized): Secondary | ICD-10-CM | POA: Diagnosis not present

## 2017-05-21 DIAGNOSIS — R600 Localized edema: Secondary | ICD-10-CM | POA: Diagnosis not present

## 2017-05-21 DIAGNOSIS — I509 Heart failure, unspecified: Secondary | ICD-10-CM | POA: Diagnosis not present

## 2017-05-22 DIAGNOSIS — M6281 Muscle weakness (generalized): Secondary | ICD-10-CM | POA: Diagnosis not present

## 2017-05-23 DIAGNOSIS — M6281 Muscle weakness (generalized): Secondary | ICD-10-CM | POA: Diagnosis not present

## 2017-05-24 DIAGNOSIS — M6281 Muscle weakness (generalized): Secondary | ICD-10-CM | POA: Diagnosis not present

## 2017-05-25 DIAGNOSIS — M6281 Muscle weakness (generalized): Secondary | ICD-10-CM | POA: Diagnosis not present

## 2017-05-27 DIAGNOSIS — M6281 Muscle weakness (generalized): Secondary | ICD-10-CM | POA: Diagnosis not present

## 2017-05-28 DIAGNOSIS — D649 Anemia, unspecified: Secondary | ICD-10-CM | POA: Diagnosis not present

## 2017-05-28 DIAGNOSIS — M6281 Muscle weakness (generalized): Secondary | ICD-10-CM | POA: Diagnosis not present

## 2017-05-28 DIAGNOSIS — Z79899 Other long term (current) drug therapy: Secondary | ICD-10-CM | POA: Diagnosis not present

## 2017-05-30 DIAGNOSIS — M6281 Muscle weakness (generalized): Secondary | ICD-10-CM | POA: Diagnosis not present

## 2017-05-31 DIAGNOSIS — M6281 Muscle weakness (generalized): Secondary | ICD-10-CM | POA: Diagnosis not present

## 2017-06-01 DIAGNOSIS — M6281 Muscle weakness (generalized): Secondary | ICD-10-CM | POA: Diagnosis not present

## 2017-06-03 DIAGNOSIS — M6281 Muscle weakness (generalized): Secondary | ICD-10-CM | POA: Diagnosis not present

## 2017-06-04 DIAGNOSIS — M6281 Muscle weakness (generalized): Secondary | ICD-10-CM | POA: Diagnosis not present

## 2017-06-07 DIAGNOSIS — D649 Anemia, unspecified: Secondary | ICD-10-CM | POA: Diagnosis not present

## 2017-06-07 DIAGNOSIS — E870 Hyperosmolality and hypernatremia: Secondary | ICD-10-CM | POA: Diagnosis not present

## 2017-06-25 DIAGNOSIS — I4891 Unspecified atrial fibrillation: Secondary | ICD-10-CM | POA: Diagnosis not present

## 2017-06-25 DIAGNOSIS — Z7901 Long term (current) use of anticoagulants: Secondary | ICD-10-CM | POA: Diagnosis not present

## 2017-06-25 DIAGNOSIS — I509 Heart failure, unspecified: Secondary | ICD-10-CM | POA: Diagnosis not present

## 2017-06-29 DIAGNOSIS — D649 Anemia, unspecified: Secondary | ICD-10-CM | POA: Diagnosis not present

## 2017-06-29 DIAGNOSIS — Z79899 Other long term (current) drug therapy: Secondary | ICD-10-CM | POA: Diagnosis not present

## 2017-08-27 DIAGNOSIS — I509 Heart failure, unspecified: Secondary | ICD-10-CM | POA: Diagnosis not present

## 2017-08-27 DIAGNOSIS — Z7901 Long term (current) use of anticoagulants: Secondary | ICD-10-CM | POA: Diagnosis not present

## 2017-08-27 DIAGNOSIS — I4891 Unspecified atrial fibrillation: Secondary | ICD-10-CM | POA: Diagnosis not present

## 2017-09-16 DIAGNOSIS — M6281 Muscle weakness (generalized): Secondary | ICD-10-CM | POA: Diagnosis not present

## 2017-09-17 DIAGNOSIS — D649 Anemia, unspecified: Secondary | ICD-10-CM | POA: Diagnosis not present

## 2017-09-17 DIAGNOSIS — M810 Age-related osteoporosis without current pathological fracture: Secondary | ICD-10-CM | POA: Diagnosis not present

## 2017-09-17 DIAGNOSIS — M6281 Muscle weakness (generalized): Secondary | ICD-10-CM | POA: Diagnosis not present

## 2017-09-17 DIAGNOSIS — Z23 Encounter for immunization: Secondary | ICD-10-CM | POA: Diagnosis not present

## 2017-09-17 DIAGNOSIS — K5900 Constipation, unspecified: Secondary | ICD-10-CM | POA: Diagnosis not present

## 2017-09-17 DIAGNOSIS — I509 Heart failure, unspecified: Secondary | ICD-10-CM | POA: Diagnosis not present

## 2017-09-17 DIAGNOSIS — Z7901 Long term (current) use of anticoagulants: Secondary | ICD-10-CM | POA: Diagnosis not present

## 2017-09-17 DIAGNOSIS — D510 Vitamin B12 deficiency anemia due to intrinsic factor deficiency: Secondary | ICD-10-CM | POA: Diagnosis not present

## 2017-09-17 DIAGNOSIS — J449 Chronic obstructive pulmonary disease, unspecified: Secondary | ICD-10-CM | POA: Diagnosis not present

## 2017-09-17 DIAGNOSIS — I10 Essential (primary) hypertension: Secondary | ICD-10-CM | POA: Diagnosis not present

## 2017-09-17 DIAGNOSIS — I4891 Unspecified atrial fibrillation: Secondary | ICD-10-CM | POA: Diagnosis not present

## 2017-09-18 DIAGNOSIS — M6281 Muscle weakness (generalized): Secondary | ICD-10-CM | POA: Diagnosis not present

## 2017-09-19 DIAGNOSIS — M6281 Muscle weakness (generalized): Secondary | ICD-10-CM | POA: Diagnosis not present

## 2017-09-20 DIAGNOSIS — M6281 Muscle weakness (generalized): Secondary | ICD-10-CM | POA: Diagnosis not present

## 2017-09-23 DIAGNOSIS — M6281 Muscle weakness (generalized): Secondary | ICD-10-CM | POA: Diagnosis not present

## 2017-09-24 DIAGNOSIS — M6281 Muscle weakness (generalized): Secondary | ICD-10-CM | POA: Diagnosis not present

## 2017-09-25 DIAGNOSIS — M6281 Muscle weakness (generalized): Secondary | ICD-10-CM | POA: Diagnosis not present

## 2017-09-26 DIAGNOSIS — M6281 Muscle weakness (generalized): Secondary | ICD-10-CM | POA: Diagnosis not present

## 2017-10-31 DIAGNOSIS — Z79899 Other long term (current) drug therapy: Secondary | ICD-10-CM | POA: Diagnosis not present

## 2017-10-31 DIAGNOSIS — D649 Anemia, unspecified: Secondary | ICD-10-CM | POA: Diagnosis not present

## 2017-11-07 DIAGNOSIS — R609 Edema, unspecified: Secondary | ICD-10-CM | POA: Diagnosis not present

## 2017-11-07 DIAGNOSIS — L539 Erythematous condition, unspecified: Secondary | ICD-10-CM | POA: Diagnosis not present

## 2018-02-10 DIAGNOSIS — R1312 Dysphagia, oropharyngeal phase: Secondary | ICD-10-CM | POA: Diagnosis not present

## 2018-02-11 DIAGNOSIS — R1312 Dysphagia, oropharyngeal phase: Secondary | ICD-10-CM | POA: Diagnosis not present

## 2018-02-12 DIAGNOSIS — R1312 Dysphagia, oropharyngeal phase: Secondary | ICD-10-CM | POA: Diagnosis not present

## 2018-02-13 DIAGNOSIS — R1312 Dysphagia, oropharyngeal phase: Secondary | ICD-10-CM | POA: Diagnosis not present

## 2018-02-15 DIAGNOSIS — R1312 Dysphagia, oropharyngeal phase: Secondary | ICD-10-CM | POA: Diagnosis not present

## 2018-02-17 DIAGNOSIS — R1312 Dysphagia, oropharyngeal phase: Secondary | ICD-10-CM | POA: Diagnosis not present

## 2018-02-19 DIAGNOSIS — R1312 Dysphagia, oropharyngeal phase: Secondary | ICD-10-CM | POA: Diagnosis not present

## 2018-02-20 DIAGNOSIS — R1312 Dysphagia, oropharyngeal phase: Secondary | ICD-10-CM | POA: Diagnosis not present

## 2018-02-21 DIAGNOSIS — R1312 Dysphagia, oropharyngeal phase: Secondary | ICD-10-CM | POA: Diagnosis not present

## 2018-02-22 DIAGNOSIS — R1312 Dysphagia, oropharyngeal phase: Secondary | ICD-10-CM | POA: Diagnosis not present

## 2018-02-25 DIAGNOSIS — I509 Heart failure, unspecified: Secondary | ICD-10-CM | POA: Diagnosis not present

## 2018-02-25 DIAGNOSIS — I482 Chronic atrial fibrillation: Secondary | ICD-10-CM | POA: Diagnosis not present

## 2018-02-25 DIAGNOSIS — J449 Chronic obstructive pulmonary disease, unspecified: Secondary | ICD-10-CM | POA: Diagnosis not present

## 2018-02-25 DIAGNOSIS — G301 Alzheimer's disease with late onset: Secondary | ICD-10-CM | POA: Diagnosis not present

## 2018-04-09 ENCOUNTER — Ambulatory Visit: Payer: Self-pay | Admitting: *Deleted

## 2018-04-10 DIAGNOSIS — L03031 Cellulitis of right toe: Secondary | ICD-10-CM | POA: Diagnosis not present

## 2018-04-10 DIAGNOSIS — B351 Tinea unguium: Secondary | ICD-10-CM | POA: Diagnosis not present

## 2018-04-30 DIAGNOSIS — G301 Alzheimer's disease with late onset: Secondary | ICD-10-CM | POA: Diagnosis not present

## 2018-04-30 DIAGNOSIS — B351 Tinea unguium: Secondary | ICD-10-CM | POA: Diagnosis not present

## 2018-05-09 DIAGNOSIS — H00014 Hordeolum externum left upper eyelid: Secondary | ICD-10-CM | POA: Diagnosis not present

## 2018-05-15 DIAGNOSIS — I10 Essential (primary) hypertension: Secondary | ICD-10-CM | POA: Diagnosis not present

## 2018-05-19 DIAGNOSIS — H109 Unspecified conjunctivitis: Secondary | ICD-10-CM | POA: Diagnosis not present

## 2018-05-19 DIAGNOSIS — G301 Alzheimer's disease with late onset: Secondary | ICD-10-CM | POA: Diagnosis not present

## 2018-05-19 DIAGNOSIS — H00014 Hordeolum externum left upper eyelid: Secondary | ICD-10-CM | POA: Diagnosis not present

## 2018-05-20 DIAGNOSIS — R1311 Dysphagia, oral phase: Secondary | ICD-10-CM | POA: Diagnosis not present

## 2018-05-21 DIAGNOSIS — R1311 Dysphagia, oral phase: Secondary | ICD-10-CM | POA: Diagnosis not present

## 2018-05-22 DIAGNOSIS — R1311 Dysphagia, oral phase: Secondary | ICD-10-CM | POA: Diagnosis not present

## 2018-05-23 DIAGNOSIS — R1311 Dysphagia, oral phase: Secondary | ICD-10-CM | POA: Diagnosis not present

## 2018-05-26 DIAGNOSIS — R1311 Dysphagia, oral phase: Secondary | ICD-10-CM | POA: Diagnosis not present

## 2018-05-27 DIAGNOSIS — R1311 Dysphagia, oral phase: Secondary | ICD-10-CM | POA: Diagnosis not present

## 2018-05-28 DIAGNOSIS — R1311 Dysphagia, oral phase: Secondary | ICD-10-CM | POA: Diagnosis not present

## 2018-05-29 DIAGNOSIS — R1311 Dysphagia, oral phase: Secondary | ICD-10-CM | POA: Diagnosis not present

## 2018-05-30 DIAGNOSIS — R1311 Dysphagia, oral phase: Secondary | ICD-10-CM | POA: Diagnosis not present

## 2018-06-02 DIAGNOSIS — R1311 Dysphagia, oral phase: Secondary | ICD-10-CM | POA: Diagnosis not present

## 2018-07-01 DIAGNOSIS — J449 Chronic obstructive pulmonary disease, unspecified: Secondary | ICD-10-CM | POA: Diagnosis not present

## 2018-07-01 DIAGNOSIS — I482 Chronic atrial fibrillation: Secondary | ICD-10-CM | POA: Diagnosis not present

## 2018-07-01 DIAGNOSIS — M810 Age-related osteoporosis without current pathological fracture: Secondary | ICD-10-CM | POA: Diagnosis not present

## 2018-07-01 DIAGNOSIS — I509 Heart failure, unspecified: Secondary | ICD-10-CM | POA: Diagnosis not present

## 2018-07-02 DIAGNOSIS — Z79899 Other long term (current) drug therapy: Secondary | ICD-10-CM | POA: Diagnosis not present

## 2018-07-18 DIAGNOSIS — M79674 Pain in right toe(s): Secondary | ICD-10-CM | POA: Diagnosis not present

## 2018-07-18 DIAGNOSIS — B351 Tinea unguium: Secondary | ICD-10-CM | POA: Diagnosis not present

## 2018-07-18 DIAGNOSIS — I739 Peripheral vascular disease, unspecified: Secondary | ICD-10-CM | POA: Diagnosis not present

## 2018-08-04 DIAGNOSIS — H04129 Dry eye syndrome of unspecified lacrimal gland: Secondary | ICD-10-CM | POA: Diagnosis not present

## 2018-08-21 DIAGNOSIS — Z961 Presence of intraocular lens: Secondary | ICD-10-CM | POA: Diagnosis not present

## 2018-08-21 DIAGNOSIS — H25011 Cortical age-related cataract, right eye: Secondary | ICD-10-CM | POA: Diagnosis not present

## 2018-08-21 DIAGNOSIS — H2511 Age-related nuclear cataract, right eye: Secondary | ICD-10-CM | POA: Diagnosis not present

## 2018-08-21 DIAGNOSIS — H04123 Dry eye syndrome of bilateral lacrimal glands: Secondary | ICD-10-CM | POA: Diagnosis not present

## 2018-09-01 DIAGNOSIS — I4891 Unspecified atrial fibrillation: Secondary | ICD-10-CM | POA: Diagnosis not present

## 2018-09-01 DIAGNOSIS — M6281 Muscle weakness (generalized): Secondary | ICD-10-CM | POA: Diagnosis not present

## 2018-09-01 DIAGNOSIS — D649 Anemia, unspecified: Secondary | ICD-10-CM | POA: Diagnosis not present

## 2018-09-01 DIAGNOSIS — J449 Chronic obstructive pulmonary disease, unspecified: Secondary | ICD-10-CM | POA: Diagnosis not present

## 2018-09-01 DIAGNOSIS — K5900 Constipation, unspecified: Secondary | ICD-10-CM | POA: Diagnosis not present

## 2018-09-01 DIAGNOSIS — Z7901 Long term (current) use of anticoagulants: Secondary | ICD-10-CM | POA: Diagnosis not present

## 2018-09-01 DIAGNOSIS — M810 Age-related osteoporosis without current pathological fracture: Secondary | ICD-10-CM | POA: Diagnosis not present

## 2018-09-01 DIAGNOSIS — D510 Vitamin B12 deficiency anemia due to intrinsic factor deficiency: Secondary | ICD-10-CM | POA: Diagnosis not present

## 2018-09-01 DIAGNOSIS — I10 Essential (primary) hypertension: Secondary | ICD-10-CM | POA: Diagnosis not present

## 2018-09-01 DIAGNOSIS — I509 Heart failure, unspecified: Secondary | ICD-10-CM | POA: Diagnosis not present

## 2018-09-01 DIAGNOSIS — Z23 Encounter for immunization: Secondary | ICD-10-CM | POA: Diagnosis not present

## 2018-09-11 DIAGNOSIS — G301 Alzheimer's disease with late onset: Secondary | ICD-10-CM | POA: Diagnosis not present

## 2018-09-11 DIAGNOSIS — M159 Polyosteoarthritis, unspecified: Secondary | ICD-10-CM | POA: Diagnosis not present

## 2018-09-11 DIAGNOSIS — I482 Chronic atrial fibrillation, unspecified: Secondary | ICD-10-CM | POA: Diagnosis not present

## 2018-09-19 DIAGNOSIS — L603 Nail dystrophy: Secondary | ICD-10-CM | POA: Diagnosis not present

## 2018-09-19 DIAGNOSIS — I739 Peripheral vascular disease, unspecified: Secondary | ICD-10-CM | POA: Diagnosis not present

## 2018-09-19 DIAGNOSIS — B351 Tinea unguium: Secondary | ICD-10-CM | POA: Diagnosis not present

## 2018-10-08 DIAGNOSIS — H00012 Hordeolum externum right lower eyelid: Secondary | ICD-10-CM | POA: Diagnosis not present

## 2018-10-08 DIAGNOSIS — G301 Alzheimer's disease with late onset: Secondary | ICD-10-CM | POA: Diagnosis not present

## 2018-10-30 DIAGNOSIS — I4821 Permanent atrial fibrillation: Secondary | ICD-10-CM | POA: Diagnosis not present

## 2018-10-30 DIAGNOSIS — M810 Age-related osteoporosis without current pathological fracture: Secondary | ICD-10-CM | POA: Diagnosis not present

## 2018-10-30 DIAGNOSIS — I10 Essential (primary) hypertension: Secondary | ICD-10-CM | POA: Diagnosis not present

## 2018-10-30 DIAGNOSIS — I509 Heart failure, unspecified: Secondary | ICD-10-CM | POA: Diagnosis not present

## 2018-11-25 DIAGNOSIS — I509 Heart failure, unspecified: Secondary | ICD-10-CM | POA: Diagnosis not present

## 2018-11-25 DIAGNOSIS — G301 Alzheimer's disease with late onset: Secondary | ICD-10-CM | POA: Diagnosis not present

## 2018-12-10 DIAGNOSIS — I739 Peripheral vascular disease, unspecified: Secondary | ICD-10-CM | POA: Diagnosis not present

## 2018-12-10 DIAGNOSIS — B351 Tinea unguium: Secondary | ICD-10-CM | POA: Diagnosis not present

## 2019-01-22 DIAGNOSIS — I4821 Permanent atrial fibrillation: Secondary | ICD-10-CM | POA: Diagnosis not present

## 2019-01-22 DIAGNOSIS — G301 Alzheimer's disease with late onset: Secondary | ICD-10-CM | POA: Diagnosis not present

## 2019-01-22 DIAGNOSIS — J449 Chronic obstructive pulmonary disease, unspecified: Secondary | ICD-10-CM | POA: Diagnosis not present

## 2021-01-24 DEATH — deceased
